# Patient Record
Sex: Female | Born: 1985
Health system: Southern US, Community
[De-identification: ages and names within clinical notes are randomized; demographics above are authoritative.]

## PROBLEM LIST (undated history)

## (undated) DIAGNOSIS — L404 Guttate psoriasis: Secondary | ICD-10-CM

## (undated) HISTORY — PX: APPENDECTOMY: SHX54

## (undated) HISTORY — PX: LAPAROSCOPIC APPENDECTOMY: SUR753

---
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
## 2003-11-28 ENCOUNTER — Other Ambulatory Visit: Admission: RE | Admit: 2003-11-28 | Discharge: 2003-11-28 | Payer: Self-pay | Admitting: Obstetrics and Gynecology

## 2005-01-25 ENCOUNTER — Other Ambulatory Visit: Admission: RE | Admit: 2005-01-25 | Discharge: 2005-01-25 | Payer: Self-pay | Admitting: Obstetrics and Gynecology

## 2006-03-22 ENCOUNTER — Inpatient Hospital Stay (HOSPITAL_COMMUNITY): Admission: AD | Admit: 2006-03-22 | Discharge: 2006-03-25 | Payer: Self-pay | Admitting: Obstetrics and Gynecology

## 2010-05-20 ENCOUNTER — Other Ambulatory Visit: Payer: Self-pay | Admitting: General Surgery

## 2010-05-20 ENCOUNTER — Ambulatory Visit (HOSPITAL_COMMUNITY)
Admission: AD | Admit: 2010-05-20 | Discharge: 2010-05-20 | Disposition: A | Discharge status: Home / Self Care | Payer: BC Managed Care – PPO | Source: Ambulatory Visit | Attending: *Deleted | Admitting: *Deleted

## 2010-05-20 ENCOUNTER — Observation Stay (HOSPITAL_COMMUNITY)
Admission: EM | Admit: 2010-05-20 | Discharge: 2010-05-21 | Disposition: A | Discharge status: Home / Self Care | Payer: BC Managed Care – PPO | Attending: Surgery | Admitting: Surgery

## 2010-05-20 ENCOUNTER — Ambulatory Visit (HOSPITAL_COMMUNITY)
Admission: RE | Admit: 2010-05-20 | Discharge: 2010-05-20 | Disposition: A | Discharge status: Home / Self Care | Payer: BC Managed Care – PPO | Source: Ambulatory Visit | Attending: *Deleted | Admitting: *Deleted

## 2010-05-20 ENCOUNTER — Other Ambulatory Visit: Payer: Self-pay | Admitting: *Deleted

## 2010-05-20 DIAGNOSIS — R1031 Right lower quadrant pain: Secondary | ICD-10-CM

## 2010-05-20 DIAGNOSIS — Z01812 Encounter for preprocedural laboratory examination: Secondary | ICD-10-CM | POA: Insufficient documentation

## 2010-05-20 DIAGNOSIS — R10813 Right lower quadrant abdominal tenderness: Secondary | ICD-10-CM | POA: Insufficient documentation

## 2010-05-20 DIAGNOSIS — K3580 Unspecified acute appendicitis: Principal | ICD-10-CM | POA: Insufficient documentation

## 2010-05-20 LAB — COMPREHENSIVE METABOLIC PANEL
AST: 15 U/L (ref 0–37)
Alkaline Phosphatase: 50 U/L (ref 39–117)
BUN: 7 mg/dL (ref 6–23)
CO2: 24 mEq/L (ref 19–32)
Chloride: 99 mEq/L (ref 96–112)
Creatinine, Ser: 0.66 mg/dL (ref 0.4–1.2)
GFR calc Af Amer: >60 mL/min (ref >60)
GFR calc non Af Amer: >60 mL/min (ref >60)
Total Bilirubin: 0.8 mg/dL (ref 0.3–1.2)

## 2010-05-20 LAB — CBC
Hemoglobin: 12.5 g/dL (ref 12.0–15.0)
MCH: 33.1 pg (ref 26.0–34.0)
RBC: 3.78 MIL/uL — ABNORMAL LOW (ref 3.87–5.11)

## 2010-05-20 LAB — DIFFERENTIAL
Basophils Relative: 0 % (ref 0–1)
Monocytes Relative: 8 % (ref 3–12)
Neutro Abs: 6 10*3/uL (ref 1.7–7.7)
Neutrophils Relative %: 63 % (ref 43–77)

## 2010-05-20 MED ORDER — IOHEXOL 300 MG/ML  SOLN: 100.0000 mL | Freq: Once | INTRAMUSCULAR | Status: DC | PRN

## 2010-05-21 LAB — GLUCOSE, CAPILLARY: Glucose-Capillary: 87 mg/dL (ref 70–99)

## 2010-05-21 LAB — CBC
Hemoglobin: 10.7 g/dL — ABNORMAL LOW (ref 12.0–15.0)
MCHC: 34.4 g/dL (ref 30.0–36.0)
Platelets: 210 10*3/uL (ref 150–400)

## 2010-05-22 NOTE — Op Note (Signed)
Linda Lopez, Linda Lopez       ACCOUNT NO.:  [id number]  MEDICAL RECORD NO.:  [id number]           PATIENT TYPE:  I  LOCATION:  5154                         FACILITY:  [facility]  PHYSICIAN:  Almond Lint, MD       DATE OF BIRTH:  05-20-1985  DATE OF PROCEDURE:  05/20/2010 DATE OF DISCHARGE:                              OPERATIVE REPORT   PREOPERATIVE DIAGNOSIS:  Acute appendicitis.  POSTOPERATIVE DIAGNOSIS:  Acute appendicitis.  PROCEDURE:  Laparoscopic appendectomy.  SURGEON:  Almond Lint, MD  ASSISTANT:  None.  ANESTHESIA:  General and local.  FINDINGS:  Retrocecal acutely inflamed appendix, cecum was adherent to the anterior abdominal wall.  SPECIMEN:  Appendix to Pathology.  ESTIMATED BLOOD LOSS:  Minimal.  COMPLICATIONS:  None known.  PROCEDURE:  Ms. Loudin was identified in the holding area and taken to operating room where she was placed supine on the operating room table.  General anesthesia was induced.  A Foley catheter was placed, and her left arm was tucked.  Her abdomen was prepped and draped in a sterile fashion.  Time-out was performed according to the surgical safety check list.  When all was correct, we continued.  The infraumbilical skin was anesthetized with local anesthetic, and a curvilinear transverse incision was made with a #15 blade.  The subcutaneous tissues were spread with a Kelly, and the umbilical fascia was elevated with Kocher clamp.  The fascia was elevated on both sides of the midline, and the midline was incised with a #15 blade.  The Tresa Endo was used to confirm entrance into the peritoneal cavity.  A 0 Vicryl pursestring suture was placed around the fascial incision and the Hasson trocar was introduced into the abdomen.  The tails of the suture were used to hold the trocar in place to the abdominal wall. Pneumoperitoneum was achieved to a pressure of 15 mmHg.    A 5-mm trocar was placed into the suprapubic region under  direct visualization after administration of local and additional one was placed into the left lower quadrant.  The cecum was visualized but the appendix was identified readily.  The cecum was reflected medially, and the appendix was adherent to the posterior cecum.  The appendix was not suppurative or gangrenous but certainly was firm and inflamed.  The appendix was elevated and the some of the alveolar attachments to the cecum were taken down bluntly.  The remainder of the attachments were taken down with the Harmonic scalpel.  Care was taken to keep the Harmonic scalpel way from the cecum.  The mesoappendix was identified, and this was divided with the Harmonic as well.  The appendiceal artery was divided with the harmonic scalpel.  Once the base of the appendix was skeletonized, the 10-mm scope was switched out to the 5-mm scope and the Endo-GIA was used to fire across the base of the appendix.  The appendix was then retrieved from the abdomen in an EndoCatch bag through the umbilical port.  The pneumoperitoneum was retrieved, and the staple line was examined.  There was no evidence of bleeding from the staple line, and there was no evidence of blood or purulent drainage in the  pelvis. This was irrigated slightly and some of the staples were removed with suction as well.  The four quadrant inspection was performed demonstrating no evidence of blood pooling or gross pathology in any of the other quadrants.  The staple line was reexamined and confirmed that there was no bleeding.  At this point, the patient was placed back flat, and the pneumoperitoneum was allowed to evacuate.  The pursestring suture was tied down with the 0 Vicryl.  There was no residual palpable fascial defect.  The skin of all the incisions was closed with 4-0 Monocryl in a subcuticular fashion.  The wounds were then cleaned, dried, and dressed with Dermabond.  The patient was awakened from anesthesia and taken to  PACU in stable condition.  Needle and sponge counts were correct.     Almond Lint, MD     FB/MEDQ  D:  05/20/2010  T:  05/21/2010  Job:  [id number]  Electronically Signed by Almond Lint MD on 05/22/2010 09:30:38 AM

## 2010-05-22 NOTE — H&P (Signed)
  NAMEMARCOS, Linda Lopez       ACCOUNT NO.:  [id number]  MEDICAL RECORD NO.:  [id number]           PATIENT TYPE:  I  LOCATION:  5154                         FACILITY:  [facility]  PHYSICIAN:  Almond Lint, MD       DATE OF BIRTH:  1986-01-03  DATE OF ADMISSION:  05/20/2010 DATE OF DISCHARGE:                             HISTORY & PHYSICAL   CHIEF COMPLAINT:  Abdominal pain.  HISTORY OF PRESENT ILLNESS:  Linda Lopez is a 25 year old female who started having abdominal pain last night.  That came gradually and has progressively worsened.  It has not gotten better with anything that she does and she has not had any appetite this morning and has fell nauseated.  She has been unable to eat.  Her pain was 7/10 prior receiving IV narcotics.  She denies fevers or chills.  She denies chest pain, shortness of breath or sick contacts.  The pain has always been in the right lower quadrant and has not migrated.  She does think it radiates a little bit to her right back.  PAST MEDICAL HISTORY:  Negative.  PAST SURGICAL HISTORY:  Negative.  FAMILY HISTORY:  No severe chronic illnesses that she is aware of.  SOCIAL HISTORY:  No substance abuse.  She is accompanied by her husband as well as son.  ALLERGIES:  DIFLUCAN causes edema of her mouth and lips.  MEDICATIONS:  Oral contraceptive pills.  REVIEW OF SYSTEMS:  Otherwise negative x11 systems.  PHYSICAL EXAMINATION:  VITAL SIGNS:  Temperature 98.4, pulse 77, respiratory rate 16, blood pressure 120/72, sats 100% on room air. GENERAL:  She is alert and oriented x3 in no acute distress and looks uncomfortable but has just been medicated. HEENT:  Normocephalic, atraumatic.  Sclerae anicteric. NECK:  Supple.  No lymphadenopathy.  No thyromegaly.  Trachea is midline. HEART:  Regular rate and rhythm.  No murmurs, rubs or gallops. LUNGS:  Clear to auscultation bilaterally.  No wheezing. ABDOMEN:  Soft, slightly distended, tender in the  right lower quadrant. Positive Rovsing sign. EXTREMITIES:  Warm and well perfused without pitting edema. SKIN:  No rashes. NEURO:  No gross motor sensory deficits.  LABORATORY FINDINGS:  White count 9.5, hemoglobin and hematocrit 12.5 And 35, platelet count 239,000.  CT shows dilated appendix and stranding consistent with early acute appendicitis.  Urine pregnancy is negative. UA is negative.  IMPRESSION:  Linda Lopez is a 26 year old female with classic history for acute appendicitis with imaging findings.  We will keep her n.p.o., IV fluids, IV antibiotics and OR for laparoscopic appendectomy. The procedure was described to the patient including the risks of the procedure including bleeding, infection, damage to other structures, I discussed the recovery time and the restrictions.  I discussed the actual procedure in detail and the hospital course.  We will take her to the operating room at the first available opportunity.     Almond Lint, MD     FB/MEDQ  D:  05/20/2010  T:  05/21/2010  Job:  [id number]  Electronically Signed by Almond Lint MD on 05/22/2010 09:30:06 AM

## 2012-04-03 ENCOUNTER — Encounter: Payer: Self-pay | Admitting: Gastroenterology

## 2012-04-20 ENCOUNTER — Telehealth: Payer: Self-pay | Admitting: Gastroenterology

## 2012-04-20 ENCOUNTER — Ambulatory Visit: Payer: BC Managed Care – PPO | Admitting: Gastroenterology

## 2012-04-20 NOTE — Telephone Encounter (Signed)
Do not charge per Dr. Stark. 

## 2013-12-13 ENCOUNTER — Encounter: Payer: Self-pay | Admitting: Gastroenterology

## 2013-12-14 ENCOUNTER — Other Ambulatory Visit: Payer: Self-pay | Admitting: Gastroenterology

## 2013-12-14 DIAGNOSIS — R1013 Epigastric pain: Secondary | ICD-10-CM

## 2013-12-22 ENCOUNTER — Encounter (INDEPENDENT_AMBULATORY_CARE_PROVIDER_SITE_OTHER): Payer: Self-pay

## 2013-12-22 ENCOUNTER — Ambulatory Visit
Admission: RE | Admit: 2013-12-22 | Discharge: 2013-12-22 | Disposition: A | Discharge status: Home / Self Care | Payer: BC Managed Care – PPO | Source: Ambulatory Visit | Attending: Gastroenterology | Admitting: Gastroenterology

## 2013-12-22 DIAGNOSIS — R1013 Epigastric pain: Secondary | ICD-10-CM

## 2014-02-11 ENCOUNTER — Ambulatory Visit: Payer: BC Managed Care – PPO | Admitting: Gastroenterology

## 2014-02-17 ENCOUNTER — Encounter (HOSPITAL_COMMUNITY): Payer: Self-pay | Admitting: Vascular Surgery

## 2014-02-17 ENCOUNTER — Emergency Department (HOSPITAL_COMMUNITY): Payer: 59

## 2014-02-17 ENCOUNTER — Emergency Department (HOSPITAL_COMMUNITY)
Admission: EM | Admit: 2014-02-17 | Discharge: 2014-02-17 | Disposition: A | Discharge status: Home / Self Care | Payer: 59 | Attending: Emergency Medicine | Admitting: Emergency Medicine

## 2014-02-17 DIAGNOSIS — R0789 Other chest pain: Secondary | ICD-10-CM | POA: Insufficient documentation

## 2014-02-17 DIAGNOSIS — R079 Chest pain, unspecified: Secondary | ICD-10-CM

## 2014-02-17 DIAGNOSIS — Z3202 Encounter for pregnancy test, result negative: Secondary | ICD-10-CM | POA: Insufficient documentation

## 2014-02-17 DIAGNOSIS — R0602 Shortness of breath: Secondary | ICD-10-CM | POA: Insufficient documentation

## 2014-02-17 DIAGNOSIS — Z79899 Other long term (current) drug therapy: Secondary | ICD-10-CM | POA: Insufficient documentation

## 2014-02-17 LAB — BASIC METABOLIC PANEL
Anion gap: 16 — ABNORMAL HIGH (ref 5–15)
BUN: 15 mg/dL (ref 6–23)
CHLORIDE: 102 meq/L (ref 96–112)
CO2: 21 meq/L (ref 19–32)
CREATININE: 0.77 mg/dL (ref 0.50–1.10)
Calcium: 9.9 mg/dL (ref 8.4–10.5)
GFR calc Af Amer: >90 mL/min (ref >90)
GFR calc non Af Amer: >90 mL/min (ref >90)
GLUCOSE: 93 mg/dL (ref 70–99)
Potassium: 3.7 mEq/L (ref 3.7–5.3)
Sodium: 139 mEq/L (ref 137–147)

## 2014-02-17 LAB — CBC
HEMATOCRIT: 38.4 % (ref 36.0–46.0)
Hemoglobin: 13.4 g/dL (ref 12.0–15.0)
MCH: 32.4 pg (ref 26.0–34.0)
MCHC: 34.9 g/dL (ref 30.0–36.0)
MCV: 92.8 fL (ref 78.0–100.0)
Platelets: 260 10*3/uL (ref 150–400)
RBC: 4.14 MIL/uL (ref 3.87–5.11)
RDW: 11.6 % (ref 11.5–15.5)
WBC: 11.3 10*3/uL — AB (ref 4.0–10.5)

## 2014-02-17 LAB — POC URINE PREG, ED: PREG TEST UR: NEGATIVE

## 2014-02-17 LAB — I-STAT TROPONIN, ED: Troponin i, poc: 0 ng/mL (ref 0.00–0.08)

## 2014-02-17 LAB — D-DIMER, QUANTITATIVE (NOT AT ARMC): D DIMER QUANT: 0.35 ug{FEU}/mL (ref 0.00–0.48)

## 2014-02-17 MED ORDER — KETOROLAC TROMETHAMINE 30 MG/ML IJ SOLN
30.0000 mg | Freq: Once | INTRAMUSCULAR | Status: AC
  Administered 2014-02-17: 30 mg via INTRAVENOUS
  Filled 2014-02-17: qty 1

## 2014-02-17 MED ORDER — OMEPRAZOLE 20 MG PO CPDR
20.0000 mg | DELAYED_RELEASE_CAPSULE | Freq: Every day | ORAL | Status: DC
Start: 2014-02-17 — End: 2018-09-22

## 2014-02-17 MED ORDER — NAPROXEN 500 MG PO TABS: 500.0000 mg | ORAL_TABLET | Freq: Two times a day (BID) | ORAL | Status: DC

## 2014-02-17 MED ORDER — MORPHINE SULFATE 4 MG/ML IJ SOLN
4.0000 mg | Freq: Once | INTRAMUSCULAR | Status: AC
  Administered 2014-02-17: 4 mg via INTRAVENOUS
  Filled 2014-02-17: qty 1

## 2014-02-17 NOTE — ED Provider Notes (Signed)
CSN: [id number]     Arrival date & time 02/17/14  1711 History   First MD Initiated Contact with Patient 02/17/14 1835     Chief Complaint  Patient presents with  . Chest Pain     (Consider location/radiation/quality/duration/timing/severity/associated sxs/prior Treatment) Patient is a 28 y.o. female presenting with chest pain. The history is provided by the patient. No language interpreter was used.  Chest Pain Pain location:  Substernal area Pain quality: sharp   Pain radiates to:  Upper back Pain radiates to the back: yes   Pain severity:  Moderate Onset quality:  Unable to specify Duration: since at least 6:30am. Timing:  Constant Progression:  Waxing and waning Chronicity:  New Context: at rest   Relieved by:  Nothing Worsened by:  Movement, deep breathing and certain positions Ineffective treatments:  Rest Associated symptoms: shortness of breath   Associated symptoms: no abdominal pain, no anorexia, no anxiety, no back pain, no claudication, no cough, no diaphoresis, no fatigue, no fever, no headache, no lower extremity edema, no nausea, no numbness, no palpitations, not vomiting and no weakness   Risk factors: birth control   Risk factors: no aortic disease, no coronary artery disease, no diabetes mellitus, no Ehlers-Danlos syndrome, no high cholesterol, no hypertension, not female, no Marfan's syndrome, not obese, not pregnant, no prior DVT/PE, no smoking and no surgery     History reviewed. No pertinent past medical history. Past Surgical History  Procedure Laterality Date  . Laparoscopic appendectomy     No family history on file. History  Substance Use Topics  . Smoking status: Never Smoker   . Smokeless tobacco: Never Used  . Alcohol Use: No   OB History    No data available     Review of Systems  Constitutional: Negative for fever, chills, diaphoresis, activity change, appetite change and fatigue.  HENT: Negative for congestion, facial swelling,  rhinorrhea and sore throat.   Eyes: Negative for photophobia and discharge.  Respiratory: Positive for shortness of breath. Negative for cough and chest tightness.   Cardiovascular: Positive for chest pain. Negative for palpitations, claudication and leg swelling.  Gastrointestinal: Negative for nausea, vomiting, abdominal pain, diarrhea and anorexia.  Endocrine: Negative for polydipsia and polyuria.  Genitourinary: Negative for dysuria, frequency, difficulty urinating and pelvic pain.  Musculoskeletal: Negative for back pain, arthralgias, neck pain and neck stiffness.  Skin: Negative for color change and wound.  Allergic/Immunologic: Negative for immunocompromised state.  Neurological: Negative for facial asymmetry, weakness, numbness and headaches.  Hematological: Does not bruise/bleed easily.  Psychiatric/Behavioral: Negative for confusion and agitation.      Allergies  Diflucan  Home Medications   Prior to Admission medications   Medication Sig Start Date End Date Taking? Authorizing Provider  ibuprofen (ADVIL,MOTRIN) 200 MG tablet Take 200 mg by mouth every 6 (six) hours as needed for mild pain.   Yes Historical Provider, MD  TRI-PREVIFEM 0.18/0.215/0.25 MG-35 MCG tablet Take 1 tablet by mouth daily. 02/02/14  Yes Historical Provider, MD  naproxen (NAPROSYN) 500 MG tablet Take 1 tablet (500 mg total) by mouth 2 (two) times daily. 02/17/14   Toy CookeyMegan Docherty, MD  omeprazole (PRILOSEC) 20 MG capsule Take 1 capsule (20 mg total) by mouth daily. 02/17/14   Toy CookeyMegan Docherty, MD   BP 122/64 mmHg  Pulse 67  Temp(Src) 98.1 F (36.7 C)  Resp 22  SpO2 100%  LMP 02/03/2014 (Approximate) Physical Exam  Constitutional: She is oriented to person, place, and time. She appears well-developed and  well-nourished. No distress.  HENT:  Head: Normocephalic and atraumatic.  Mouth/Throat: No oropharyngeal exudate.  Eyes: Pupils are equal, round, and reactive to light.  Neck: Normal range of  motion. Neck supple.  Cardiovascular: Normal rate, regular rhythm and normal heart sounds.  Exam reveals no gallop and no friction rub.   No murmur heard. Pulmonary/Chest: Effort normal and breath sounds normal. No respiratory distress. She has no wheezes. She has no rales.    Abdominal: Soft. Bowel sounds are normal. She exhibits no distension and no mass. There is no tenderness. There is no rebound and no guarding.  Musculoskeletal: Normal range of motion. She exhibits no edema or tenderness.  Neurological: She is alert and oriented to person, place, and time.  Skin: Skin is warm and dry.  Psychiatric: She has a normal mood and affect.    ED Course  Procedures (including critical care time) Labs Review Labs Reviewed  CBC - Abnormal; Notable for the following:    WBC 11.3 (*)    All other components within normal limits  BASIC METABOLIC PANEL - Abnormal; Notable for the following:    Anion gap 16 (*)    All other components within normal limits  D-DIMER, QUANTITATIVE  I-STAT TROPOININ, ED  POC URINE PREG, ED    Imaging Review Dg Chest 2 View  02/17/2014   CLINICAL DATA:  Chest pain.  EXAM: CHEST  2 VIEW  COMPARISON:  None.  FINDINGS: The heart size and mediastinal contours are within normal limits. Both lungs are clear. No pneumothorax or pleural effusion is noted. The visualized skeletal structures are unremarkable.  IMPRESSION: No acute cardiopulmonary abnormality seen.   Electronically Signed   By: Roque Lias M.D.   On: 02/17/2014 18:04     EKG Interpretation   Date/Time:  Thursday February 17 2014 17:19:36 EST Ventricular Rate:  94 PR Interval:  118 QRS Duration: 92 QT Interval:  434 QTC Calculation: 542 R Axis:   69 Text Interpretation:  Normal sinus rhythm with sinus arrhythmia Incomplete  right bundle branch block Nonspecific ST abnormality Prolonged QT QT  prolongation new No prior for comparison except from today Confirmed by  DOCHERTY  MD, MEGAN [phone]) on  02/17/2014 6:40:21 PM      MDM   Final diagnoses:  Atypical chest pain    Pt is a 28 y.o. female with Pmhx as above who presents with sharp midsternal chest pain with radiation to her back since about 6:30 this morning.  Pain is worse with movement and with deep breathing.  He has been constant though waxing and waning.  No associated diaphoresis, nausea, vomiting, no leg pain or swelling.  She's had no recent travel history.  She reports history of GERD and states that she stopped taking Prilosec about a month ago.  On physical exam vital signs stable.  Patient is in no acute distress.  She has reproducible midsternal chest pain.  Cardiopulmonary exam otherwise benign.  EKG without acute ischemic changes.  Troponin negative.  Chest x-ray normal.  D-dimer added as cannot use PERC rule.  D-dimer normal.  Patient felt much improved after 1 dose of Toradol and 1 dose of morphine.  She did have one episode of increased chest pain and appeared anxious with a heart rate of 110.  Vital signs since have remained stable.  She appears in no acute distress.  I doubt ACS, PE, dissection, pneumonia.  I feel pain is much more likely to be GI or musculoskeletal in nature.  I recommended she restart her Prilosec and also recommended scheduled naproxen twice a day.   Tianah L Ashe evaluation in the Emergency Department is complete. It has been determined that no acute conditions requiring further emergency intervention are present at this time. The patient/guardian have been advised of the diagnosis and plan. We have discussed signs and symptoms that warrant return to the ED, such as changes or worsening in symptoms, worsening pain, fever, leg pain or swelling.       Toy CookeyMegan Docherty, MD 02/18/14 [phone]

## 2014-02-17 NOTE — ED Notes (Signed)
Patient called for increased chest pain, heart rate 110 and patient is anxious. Reported pain and heart rate to Dr. Micheline Mazeocherty. MD acknowledges. No verbal orders at this time

## 2014-02-17 NOTE — ED Notes (Signed)
Pt reports to the ED for eval of midsternal CP. [facility] Pt describes the pain as sharp and stabbing in nature. Pt reports associated symptoms of SOB, diaphoresis, and lightheadedness. Pain is worse with inspiration. She denies any long car rides or trips. She is on the generic version of ortho-tri-cyclin. Pt slightly tachypnic. Pt A&Ox4. Face appears flushed. Skin warm and dry.

## 2014-02-17 NOTE — Discharge Instructions (Signed)

## 2015-04-11 DIAGNOSIS — W273XXA Contact with needle (sewing), initial encounter: Secondary | ICD-10-CM | POA: Diagnosis not present

## 2015-04-11 DIAGNOSIS — S91331A Puncture wound without foreign body, right foot, initial encounter: Secondary | ICD-10-CM | POA: Diagnosis not present

## 2015-04-11 DIAGNOSIS — Z7689 Persons encountering health services in other specified circumstances: Secondary | ICD-10-CM | POA: Diagnosis not present

## 2015-04-27 DIAGNOSIS — Z7721 Contact with and (suspected) exposure to potentially hazardous body fluids: Secondary | ICD-10-CM | POA: Diagnosis not present

## 2015-05-10 DIAGNOSIS — Z7721 Contact with and (suspected) exposure to potentially hazardous body fluids: Secondary | ICD-10-CM | POA: Diagnosis not present

## 2015-05-23 DIAGNOSIS — Z7721 Contact with and (suspected) exposure to potentially hazardous body fluids: Secondary | ICD-10-CM | POA: Diagnosis not present

## 2015-06-04 ENCOUNTER — Emergency Department
Admission: EM | Admit: 2015-06-04 | Discharge: 2015-06-04 | Disposition: A | Discharge status: Home / Self Care | Payer: 59 | Attending: Student | Admitting: Student

## 2015-06-04 ENCOUNTER — Encounter: Payer: Self-pay | Admitting: *Deleted

## 2015-06-04 DIAGNOSIS — K0889 Other specified disorders of teeth and supporting structures: Secondary | ICD-10-CM | POA: Diagnosis present

## 2015-06-04 DIAGNOSIS — K047 Periapical abscess without sinus: Secondary | ICD-10-CM

## 2015-06-04 DIAGNOSIS — K046 Periapical abscess with sinus: Secondary | ICD-10-CM | POA: Diagnosis not present

## 2015-06-04 MED ORDER — ONDANSETRON 8 MG PO TBDP
8.0000 mg | ORAL_TABLET | Freq: Once | ORAL | Status: AC
  Administered 2015-06-04: 8 mg via ORAL
  Filled 2015-06-04: qty 1

## 2015-06-04 MED ORDER — ONDANSETRON 4 MG PO TBDP: 4.0000 mg | ORAL_TABLET | Freq: Three times a day (TID) | ORAL | Status: DC | PRN

## 2015-06-04 MED ORDER — OXYCODONE-ACETAMINOPHEN 5-325 MG PO TABS
1.0000 | ORAL_TABLET | Freq: Once | ORAL | Status: AC
  Administered 2015-06-04: 1 via ORAL

## 2015-06-04 MED ORDER — OXYCODONE-ACETAMINOPHEN 5-325 MG PO TABS
ORAL_TABLET | ORAL | Status: AC
  Filled 2015-06-04: qty 1

## 2015-06-04 MED ORDER — OXYCODONE-ACETAMINOPHEN 5-325 MG PO TABS
1.0000 | ORAL_TABLET | ORAL | Status: DC | PRN
  Administered 2015-06-04: 1 via ORAL
  Filled 2015-06-04: qty 1

## 2015-06-04 MED ORDER — AMOXICILLIN 500 MG PO CAPS: 500.0000 mg | ORAL_CAPSULE | Freq: Three times a day (TID) | ORAL | Status: DC

## 2015-06-04 MED ORDER — ONDANSETRON 4 MG PO TBDP
4.0000 mg | ORAL_TABLET | Freq: Once | ORAL | Status: AC | PRN
  Administered 2015-06-04: 4 mg via ORAL

## 2015-06-04 MED ORDER — OXYCODONE-ACETAMINOPHEN 5-325 MG PO TABS: 1.0000 | ORAL_TABLET | ORAL | Status: DC | PRN

## 2015-06-04 MED ORDER — ONDANSETRON 4 MG PO TBDP
ORAL_TABLET | ORAL | Status: AC
  Filled 2015-06-04: qty 1

## 2015-06-04 MED ORDER — IBUPROFEN 600 MG PO TABS
600.0000 mg | ORAL_TABLET | Freq: Once | ORAL | Status: AC
  Administered 2015-06-04: 600 mg via ORAL
  Filled 2015-06-04: qty 1

## 2015-06-04 NOTE — Discharge Instructions (Signed)
Dental Abscess A dental abscess is pus in or around a tooth. HOME CARE  Take medicines only as told by your dentist.  If you were prescribed antibiotic medicine, finish all of it even if you start to feel better.  Rinse your mouth (gargle) often with salt water.  Do not drive or use heavy machinery, like a lawn mower, while taking pain medicine.  Do not apply heat to the outside of your mouth.  Keep all follow-up visits as told by your dentist. This is important. GET HELP IF:  Your pain is worse, and medicine does not help. GET HELP RIGHT AWAY IF:  You have a fever or chills.  Your symptoms suddenly get worse.  You have a very bad headache.  You have problems breathing or swallowing.  You have trouble opening your mouth.  You have puffiness (swelling) in your neck or around your eye.   This information is not intended to replace advice given to you by your health care provider. Make sure you discuss any questions you have with your health care provider.   Document Released: 07/12/2014 Document Reviewed: 07/12/2014 Elsevier Interactive Patient Education [employer]2016 Elsevier Inc.    call your dentist or one of the clinics listed below  Begin taking Amoxil 3 times a day for 10 days, Percocet for pain and ibuprofen for inflammation  OPTIONS FOR DENTAL FOLLOW UP CARE  [place] Department of Health and Human Services - Local Safety Net Dental Clinics TripDoors.comhttp://www.ncdhhs.gov/dph/oralhealth/services/safetynetclinics.htm   [facility] [phone])  [facility] [phone])  [facility] [phone])  [facility] [phone])  [facility] [phone]) This clinic caters to the indigent population and is on a lottery system. Location: [employer] [address] [address], [address], [place] Clinic Hours: Wednesdays from 6pm - 9pm, patients seen by a lottery system. For dates, call or go to  [email or web address] Services: Cleanings, fillings and simple extractions. Payment Options: DENTAL WORK IS FREE OF CHARGE. Bring proof of income or support. Best way to get seen: Arrive at 5:15 pm - this is a lottery, NOT first come/first serve, so arriving earlier will not increase your chances of being seen.     [facility] [phone] Select option 1 for emergencies   Location: [facility], [place], [address], [place] [facility] Hours: No walk-ins accepted - call the day before to schedule an appointment. Check in times are 9:30 am and 1:30 pm. Services: Simple extractions, temporary fillings, pulpectomy/pulp debridement, uncomplicated abscess drainage. Payment Options: PAYMENT IS DUE AT THE TIME OF SERVICE.  Fee is usually $100-200, additional surgical procedures (e.g. abscess drainage) may be extra. Cash, checks, Visa/MasterCard accepted.  Can file Medicaid if patient is covered for dental - patient should call case worker to check. No discount for [facility] patients. Best way to get seen: MUST call the day before and get onto the schedule. Can usually be seen the next 1-2 days. No walk-ins accepted.     [facility] [phone]   Location: [facility], [address], [place] [facility] Hours: M, W, Th, F 8am or 1:30pm, Tues 9a or 1:30 - first come/first served. Services: Simple extractions, temporary fillings, uncomplicated abscess drainage.  You do not need to be an [place] resident. Payment Options: PAYMENT IS DUE AT THE TIME OF SERVICE. Dental insurance, otherwise sliding scale - bring proof of income or support. Depending on income and  treatment needed, cost is usually $50-200. Best way to get seen: Arrive early as it is first come/first served.     [facility] [phone]   Location: [address] Clinic Hours: Mon-Thu 8a-5p Services: Most basic dental services including extractions and fillings. Payment Options: PAYMENT IS DUE AT THE TIME OF SERVICE. Sliding scale, up to 50% off - bring proof if income or support. Medicaid with dental option accepted. Best way to get seen: Call to schedule an appointment, can usually be seen within 2 weeks OR they will try to see walk-ins - show up at 8a or 2p (you may have to wait).     [facility] [phone] [place] RESIDENTS ONLY   Location: [facility], [address] [place], [place] [address] Clinic Hours: By appointment only. Monday - Thursday 8am-5pm, Friday 8am-12pm Services: Cleanings, fillings, extractions. Payment Options: PAYMENT IS DUE AT THE TIME OF SERVICE. Cash, Visa or MasterCard. Sliding scale - $30 minimum per service. Best way to get seen: Come in to office, complete packet and make an appointment - need proof of income or support monies for each household member and proof of [facility] residence. Usually takes about a month to get in.     [facility] [phone]   Location: [address], [facility] Clinic Hours: Walk-in Urgent Care Dental Services are offered Monday-Friday mornings only. The numbers of emergencies accepted daily is limited to the number of providers available. Maximum 15 - Mondays, Wednesdays & Thursdays Maximum 10 - Tuesdays & Fridays Services: You do not need to be a [facility] resident to be seen for a dental emergency. Emergencies are defined as pain, swelling, abnormal bleeding, or dental trauma. Walkins will receive x-rays if needed. NOTE: Dental cleaning is not an emergency. Payment Options: PAYMENT IS DUE AT THE TIME OF SERVICE. Minimum co-pay is $40.00 for uninsured patients. Minimum co-pay is $3.00 for Medicaid with dental coverage. Dental Insurance is accepted and must be presented at time of  visit. Medicare does not cover dental. Forms of payment: Cash, credit card, checks. Best way to get seen: If not previously registered with the clinic, walk-in dental registration begins at 7:15 am and is on a first come/first serve basis. If previously registered with the clinic, call to make an appointment.     [facility] [phone] [place] RESIDENTS ONLY   Location: [address] [place], [place] Clinic Hours: Mon-Thu 10a-2p Services: Extractions only! Payment Options: FREE (donations accepted) - bring proof of income or support Best way to get seen: Call and schedule an appointment OR come at 8am on the 1st Monday of every month (except for holidays) when it is first come/first served.     [facility] [phone]   Location: [address] [place], [place] Clinic Hours: Friday mornings Services, Payment Options, Best way to get seen: Call for info

## 2015-06-04 NOTE — ED Notes (Signed)
Pt reports pain/swelling of left upper jaw beginning Friday. Pt reports 2 emeses in last 24 hours. Pr c/o of nausea, and pain rated at a 6 out of 10 beginning at the upper left jaw, radiating down neck. PT c/o of chills, denies fever.

## 2015-06-04 NOTE — ED Provider Notes (Signed)
[facility] Emergency Department Provider Note  ____________________________________________  Time seen: Approximately 7:36 AM  I have reviewed the triage vital signs and the nursing notes.   HISTORY  Chief Complaint Dental Pain   HPI Linda Lopez is a 30 y.o. female is here with complaint of dental pain and abscess beginning Friday night. Patient states she has some swelling to her left upper jaw beginning Friday and has gotten slowly bigger during the course of the last 24 hours. Patient has been nauseous and has vomited secondary to pain. She has taken Tylenol and ibuprofen without relief.prior to examination she had been given a Percocet which helped with the pain. Prior to the Percocet patient rates her pain at 10 over 10.   History reviewed. No pertinent past medical history.  There are no active problems to display for this patient.   Past Surgical History  Procedure Laterality Date  . Laparoscopic appendectomy    . Appendectomy      Current Outpatient Rx  Name  Route  Sig  Dispense  Refill  . amoxicillin (AMOXIL) 500 MG capsule   Oral   Take 1 capsule (500 mg total) by mouth 3 (three) times daily.   30 capsule   0   . ibuprofen (ADVIL,MOTRIN) 200 MG tablet   Oral   Take 200 mg by mouth every 6 (six) hours as needed for mild pain.         . naproxen (NAPROSYN) 500 MG tablet   Oral   Take 1 tablet (500 mg total) by mouth 2 (two) times daily.   20 tablet   0   . omeprazole (PRILOSEC) 20 MG capsule   Oral   Take 1 capsule (20 mg total) by mouth daily.   30 capsule   0   . ondansetron (ZOFRAN ODT) 4 MG disintegrating tablet   Oral   Take 1 tablet (4 mg total) by mouth every 8 (eight) hours as needed for nausea or vomiting.   20 tablet   0   . oxyCODONE-acetaminophen (PERCOCET) 5-325 MG tablet   Oral   Take 1 tablet by mouth every 4 (four) hours as needed for severe pain.   20 tablet   0   . TRI-PREVIFEM 0.18/0.215/0.25  MG-35 MCG tablet   Oral   Take 1 tablet by mouth daily.      12     Allergies Diflucan  History reviewed. No pertinent family history.  Social History Social History  Substance Use Topics  . Smoking status: Never Smoker   . Smokeless tobacco: Never Used  . Alcohol Use: No    Review of Systems Constitutional: No fever/chills ENT: No sore throat.as of dental pain. Cardiovascular: Denies chest pain. Respiratory: Denies shortness of breath. Gastrointestinal: No abdominal pain.  Positive nausea, positive vomiting.   Neurological: Negative for headaches  10-point ROS otherwise negative.  ____________________________________________   PHYSICAL EXAM:  VITAL SIGNS: ED Triage Vitals  Enc Vitals Group     BP 06/04/15 0256 132/96 mmHg     Pulse Rate 06/04/15 0256 60     Resp 06/04/15 0256 22     Temp 06/04/15 0256 98.4 F (36.9 C)     Temp Source 06/04/15 0256 Oral     SpO2 06/04/15 0256 100 %     Weight 06/04/15 0256 138 lb (62.596 kg)     Height 06/04/15 0256  (1.702 m)     Head Cir --      Peak  Flow --      Pain Score 06/04/15 0257 10     Pain Loc --      Pain Edu? --      Excl. in GC? --     Constitutional: Alert and oriented. Well appearing and in no acute distress. Eyes: Conjunctivae are normal. PERRL. EOMI. Head: Atraumatic. Nose: No congestion/rhinnorhea. Mouth/Throat: Mucous membranes are moist.  Oropharynx non-erythematous.left upper gum mid line molar area there is a small raised area that appears to be a localized abscess to area. Gum around the tooth is edematous and tender. Neck: No stridor.   Hematological/Lymphatic/Immunilogical: No cervical lymphadenopathy. Cardiovascular: Normal rate, regular rhythm. Grossly normal heart sounds.  Good peripheral circulation. Respiratory: Normal respiratory effort.  No retractions. Lungs CTAB. Musculoskeletal:moves upper and lower extremities without any difficulty. Neurologic:  Normal speech and language.  No gross focal neurologic deficits are appreciated. No gait instability. Skin:  Skin is warm, dry and intact. No rash noted. Psychiatric: Mood and affect are normal. Speech and behavior are normal.  ____________________________________________   LABS (all labs ordered are listed, but only abnormal results are displayed)  Labs Reviewed - No data to display  PROCEDURES  Procedure(s) performed: None  Critical Care performed: No  ____________________________________________   INITIAL IMPRESSION / ASSESSMENT AND PLAN / ED COURSE  Pertinent labs & imaging results that were available during my care of the patient were reviewed by me and considered in my medical decision making (see chart for details).  Patient was given a prescription for amoxicillin 500 mg 3 times a day due to no insurance. She is also given a prescription for Zofran and Percocet.patient was given a list of dental clinics to follow up with.She is to start on the antibiotic today. ____________________________________________   FINAL CLINICAL IMPRESSION(S) / ED DIAGNOSES  Final diagnoses:  Dental abscess      Tommi RumpsRhonda L Keandrea Tapley, PA-C 06/04/15 0830  Gayla DossEryka A Gayle, MD 06/04/15 1233

## 2015-06-04 NOTE — ED Notes (Signed)
Pt presents w/ c/o dental pain starting Friday. Pt states her dentist was closed and the pain has worsened to causing pain under her eye and to her ear and jaw. Pt states pain is making her vomit. Pt states she has been taking tylenol and ibuprofen w/o relief.

## 2015-06-12 DIAGNOSIS — F429 Obsessive-compulsive disorder, unspecified: Secondary | ICD-10-CM | POA: Diagnosis not present

## 2015-06-12 DIAGNOSIS — Z01419 Encounter for gynecological examination (general) (routine) without abnormal findings: Secondary | ICD-10-CM | POA: Diagnosis not present

## 2015-06-12 DIAGNOSIS — Z1389 Encounter for screening for other disorder: Secondary | ICD-10-CM | POA: Diagnosis not present

## 2015-06-12 DIAGNOSIS — Z6821 Body mass index (BMI) 21.0-21.9, adult: Secondary | ICD-10-CM | POA: Diagnosis not present

## 2015-06-12 DIAGNOSIS — Z130 Encounter for screening for diseases of the blood and blood-forming organs and certain disorders involving the immune mechanism: Secondary | ICD-10-CM | POA: Diagnosis not present

## 2015-07-04 DIAGNOSIS — Z7721 Contact with and (suspected) exposure to potentially hazardous body fluids: Secondary | ICD-10-CM | POA: Diagnosis not present

## 2015-07-26 IMAGING — US US ABDOMEN COMPLETE
1 series · 14 of 25 positions shown · non-contrast
Comparison: CT abdomen pelvis of 05/20/2010

CLINICAL DATA: Epigastric abdominal pain

EXAM:
ULTRASOUND ABDOMEN COMPLETE

[Series 1: us abdomen complete · 0.26mm/px · 14 of 88 slices shown]
[im 1/88]
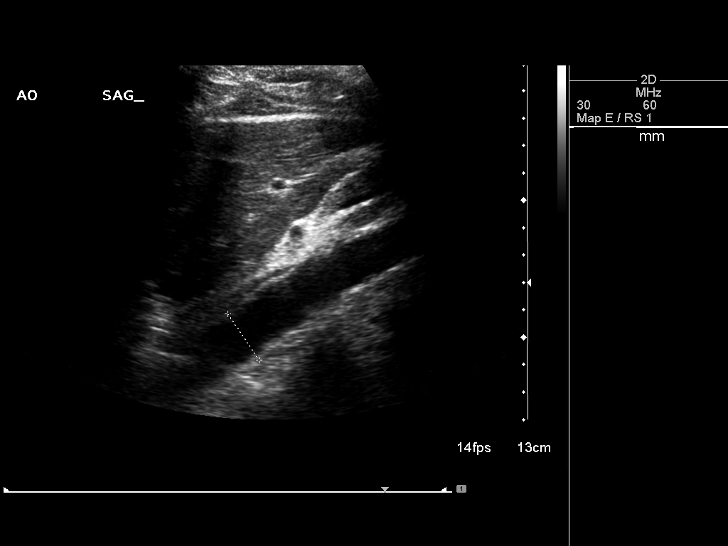
[im 8/88]
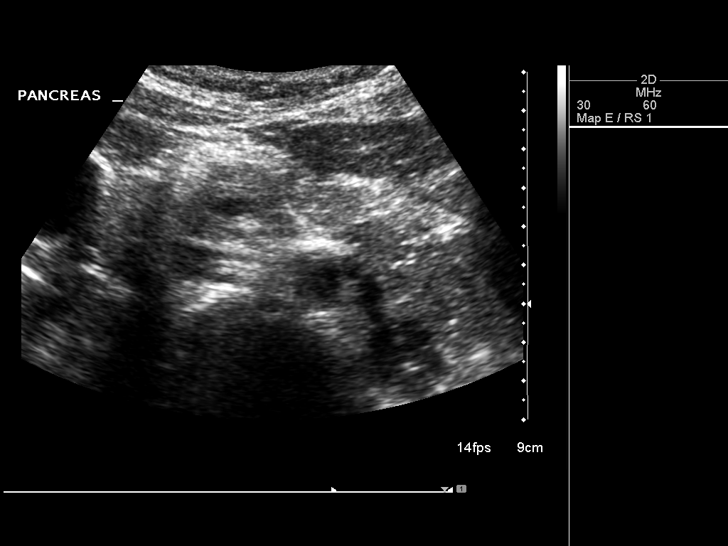
[im 15/88]
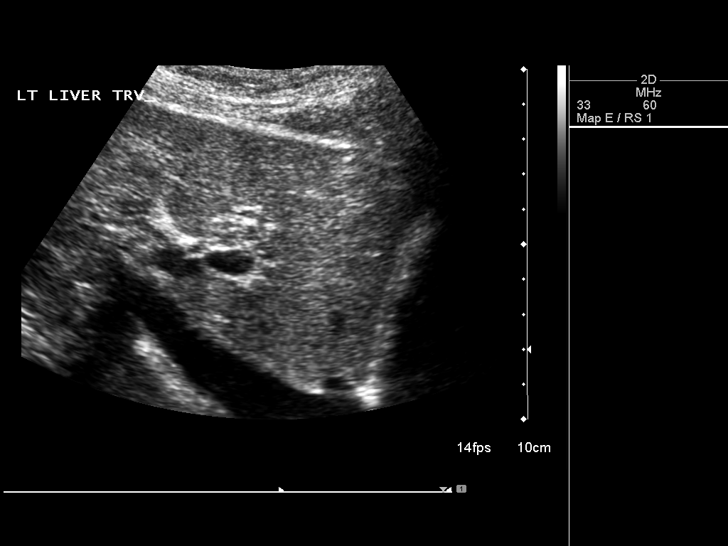
[im 22/88]
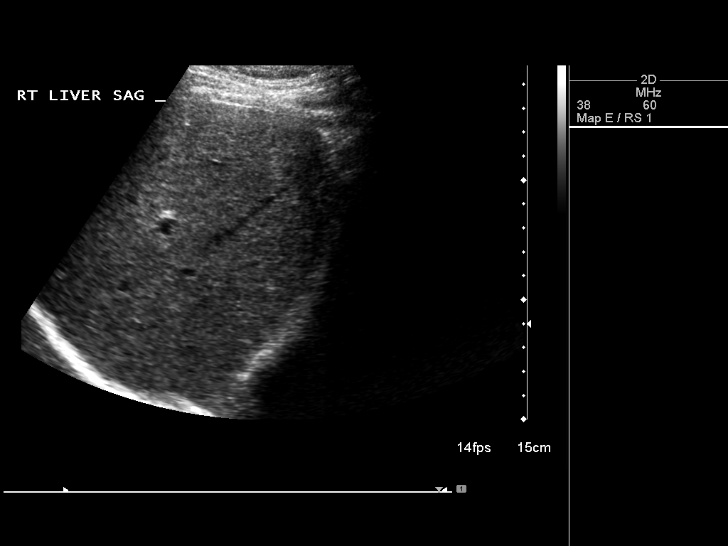
[im 30/88]
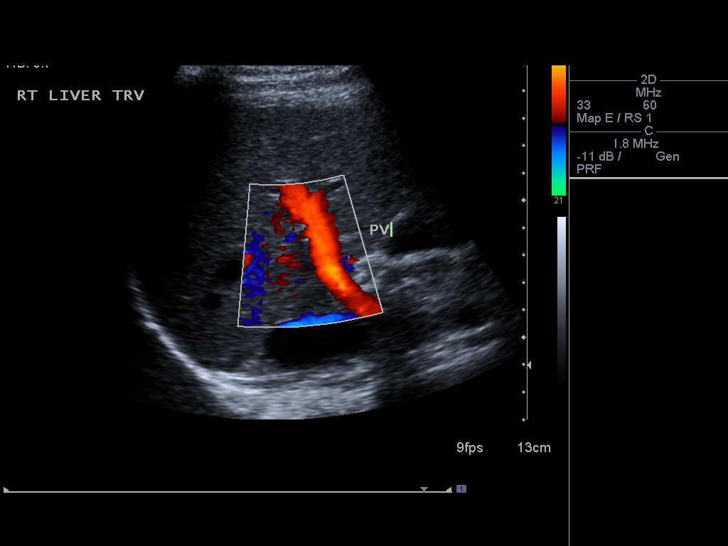
[im 33/88]
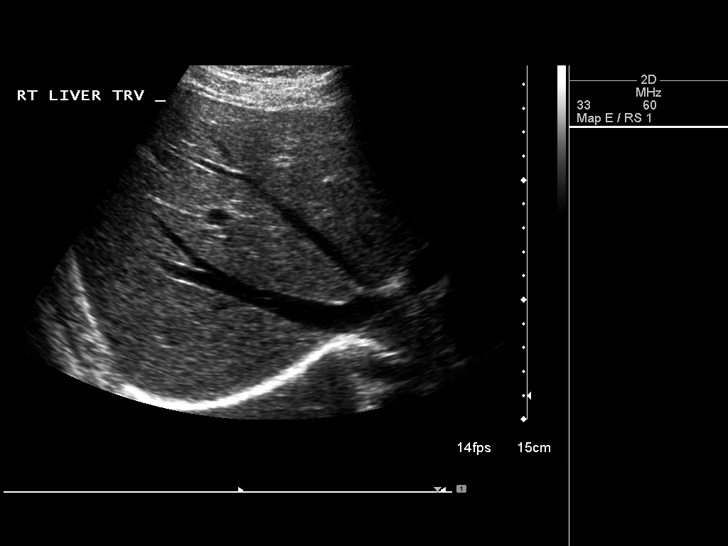
[im 40/88]
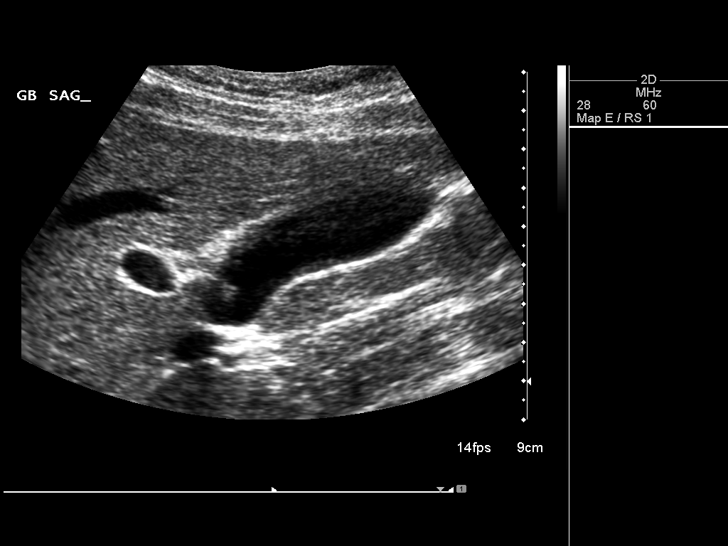
[im 48/88]
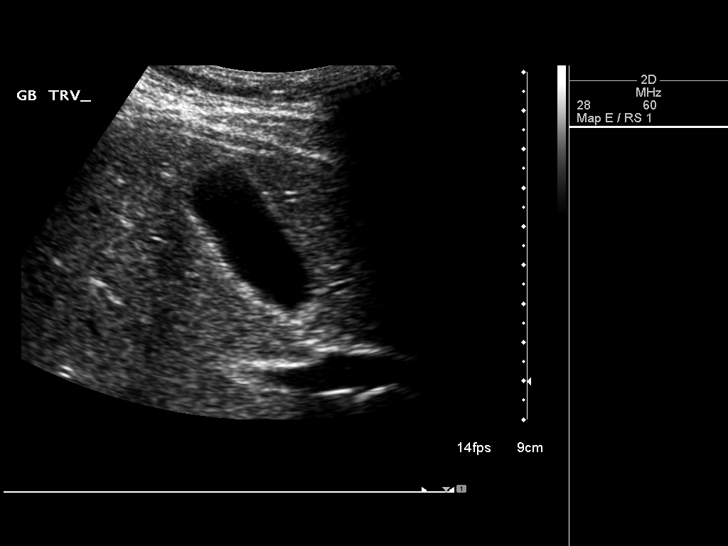
[im 55/88]
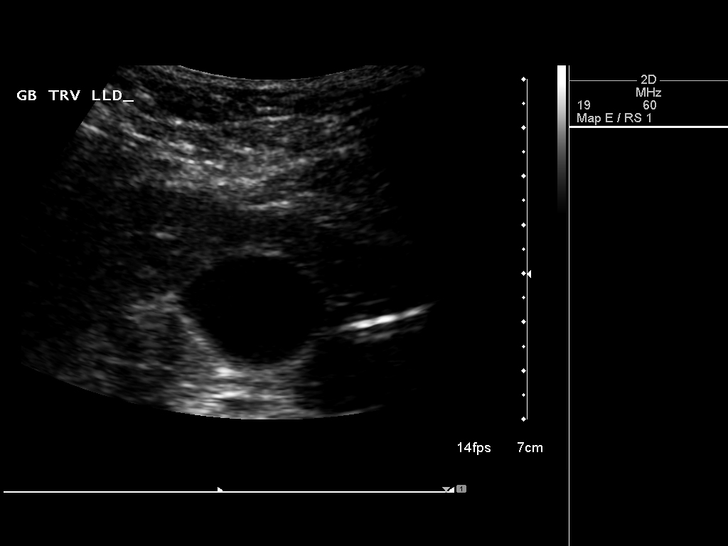
[im 59/88]
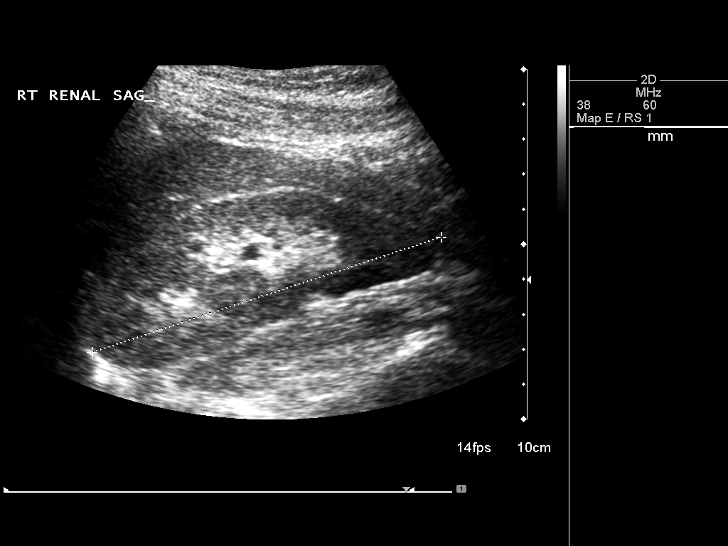
[im 66/88]
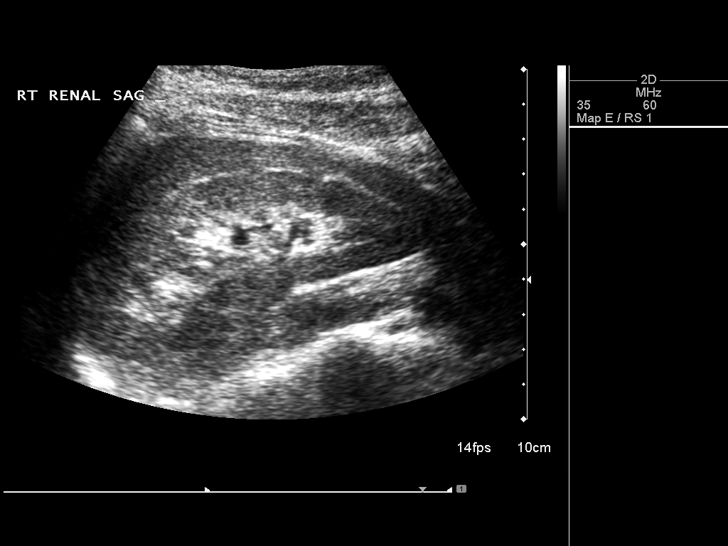
[im 73/88]
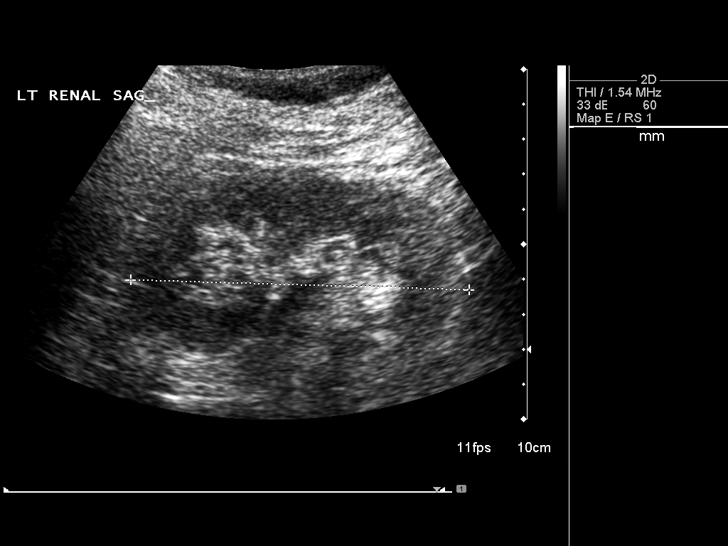
[im 80/88]
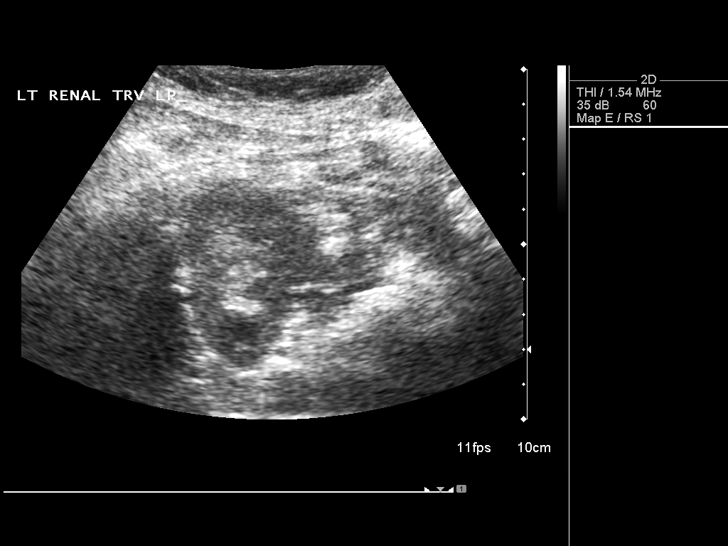
[im 88/88]
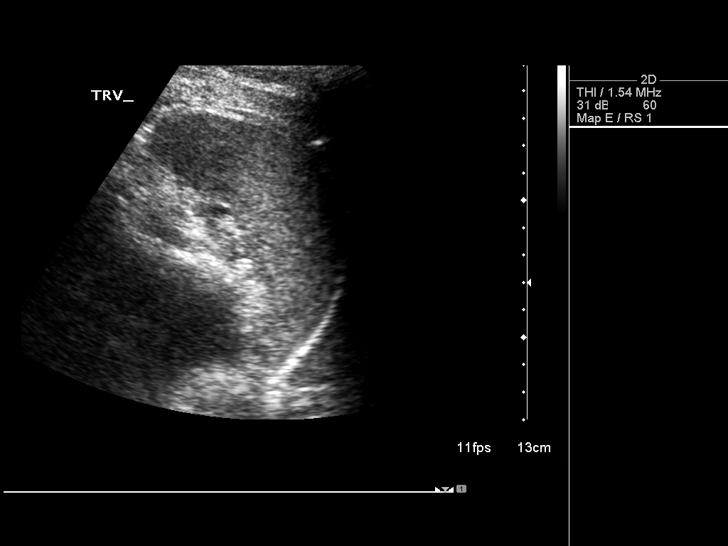

[14 of 25 positions shown; findings below may reference images not displayed]

FINDINGS: Gallbladder: The gallbladder is visualized and no gallstones are
noted. There is no pain over the gallbladder with compression.

Common bile duct: Diameter: The common bile duct is normal measuring
2.1 mm in diameter.

Liver: The liver has a normal echogenic pattern. No focal
abnormality is seen.

IVC: No abnormality visualized.

Pancreas: Visualized portion unremarkable.

Spleen: The spleen is normal measuring 7.2 cm.

Right Kidney: Length: 10.5 cm..  No hydronephrosis is seen.

Left Kidney: Length: 9.7 cm..  No hydronephrosis is noted.

Abdominal aorta: The abdominal aorta is normal caliber.

Other findings: None.
IMPRESSION: Negative abdominal ultrasound.

## 2015-09-21 IMAGING — DX DG CHEST 2V
2 series · 2 of 2 positions shown · non-contrast
Comparison: None.

CLINICAL DATA: Chest pain.

EXAM:
CHEST  2 VIEW

[chest pa]
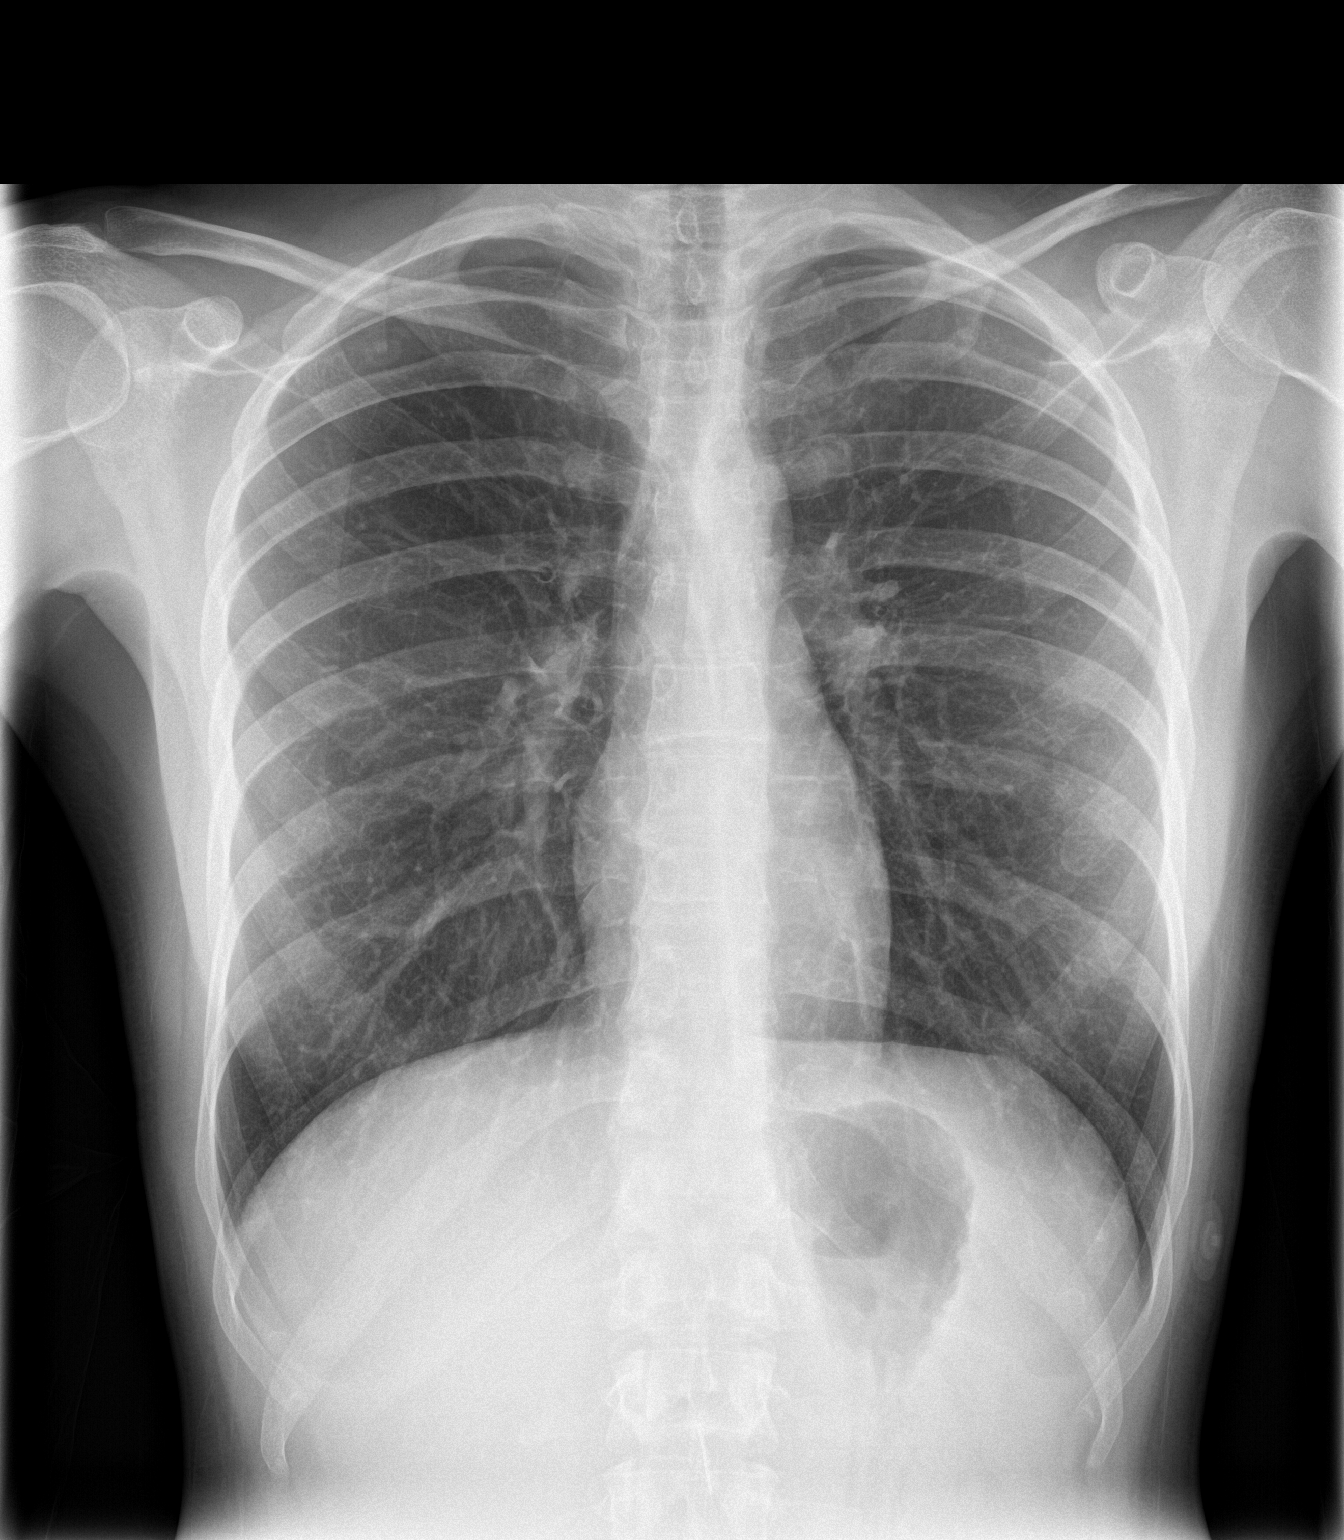

[chest lat]
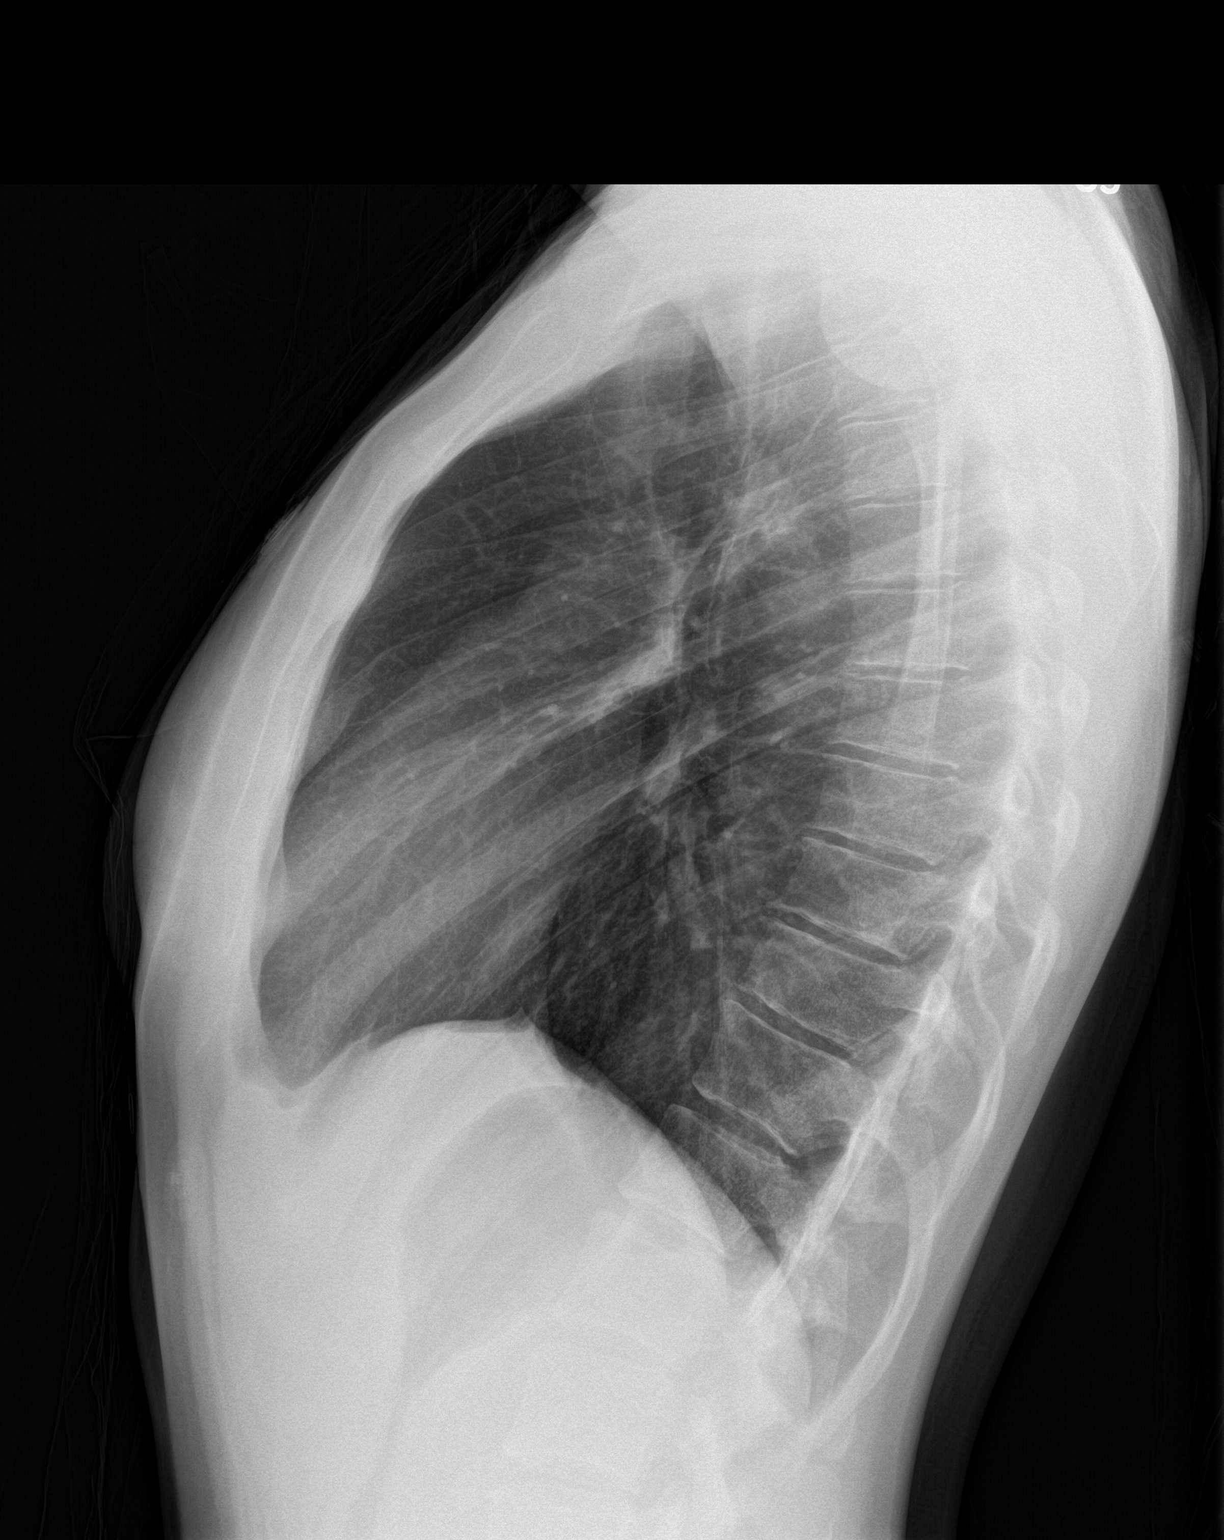

[2 of 2 positions shown; findings below may reference images not displayed]

FINDINGS: The heart size and mediastinal contours are within normal limits.
Both lungs are clear. No pneumothorax or pleural effusion is noted.
The visualized skeletal structures are unremarkable.
IMPRESSION: No acute cardiopulmonary abnormality seen.

## 2015-12-13 ENCOUNTER — Ambulatory Visit: Payer: 59 | Admitting: Physician Assistant

## 2018-09-22 ENCOUNTER — Ambulatory Visit
Admission: EM | Admit: 2018-09-22 | Discharge: 2018-09-22 | Disposition: A | Discharge status: Home / Self Care | Payer: 59 | Attending: Urgent Care | Admitting: Urgent Care

## 2018-09-22 ENCOUNTER — Other Ambulatory Visit: Payer: Self-pay

## 2018-09-22 DIAGNOSIS — L03115 Cellulitis of right lower limb: Secondary | ICD-10-CM | POA: Diagnosis not present

## 2018-09-22 DIAGNOSIS — T148XXA Other injury of unspecified body region, initial encounter: Secondary | ICD-10-CM | POA: Diagnosis not present

## 2018-09-22 MED ORDER — SULFAMETHOXAZOLE-TRIMETHOPRIM 800-160 MG PO TABS: 1.0000 | ORAL_TABLET | Freq: Two times a day (BID) | ORAL | 0 refills | Status: AC

## 2018-09-22 NOTE — Discharge Instructions (Signed)
It was very nice seeing you today in clinic. Thank you for entrusting me with your care.   Please utilize the medications that we discussed. Your prescriptions have been called in to your pharmacy. May use cool compresses to help soothe area. DO NOT rupture blister. May use Tylenol and/or Ibuprofen as needed for pain/fever.   Make arrangements to follow up with your regular doctor in 1 week for re-evaluation if not improving. If your symptoms/condition worsens, please seek follow up care either here or in the ER. Please remember, our [place] providers are "right here with you" when you need [place].   Again, it was my pleasure to take care of you today. Thank you for choosing our clinic. I hope that you start to feel better quickly.   Honor Loh, MSN, APRN, FNP-C, CEN Advanced Practice Provider [facility]

## 2018-09-22 NOTE — ED Triage Notes (Signed)
Patient complains of blister to her right ankle. Patient states that she thought it was a mosquito bite and has since developed in to a blister and has redness surrounding the area.

## 2018-09-23 NOTE — ED Provider Notes (Signed)
Linda Lopez   Name: Linda Lopez DOB: 07/28/1985 MRN: [id number] CSN: [id number] PCP: Patient, No Pcp Per  Arrival date and time:  09/22/18 1534  Chief Complaint:  Blister   NOTE: Prior to seeing the patient today, I have reviewed the triage nursing documentation and vital signs. Clinical staff has updated patient's PMH/PSHx, current medication list, and drug allergies/intolerances to ensure comprehensive history available to assist in medical decision making.   History:   HPI: Linda Lopez is a 33 y.o. female who presents today with complaints of an area of skin irritation to the distal aspect of her RIGHT lower extremity. Area declared yesterday. Patient denies any thermal injuries, known trauma, or known insect bites. Patient reports that she initially thought it was a mosquito bite as area was small, raised, and pruritic. Area quickly progressed into a fluid filled blister. Throughout the day today, patient has developed increased peri-wound erythema and associated pain. She advises that the pain is worse when she ambulates. Patient is employed in a nearby [employer], [employer] practice and was sent to urgent care for further evaluation and antimicrobial therapy.    History reviewed. No pertinent past medical history.  Past Surgical History:  Procedure Laterality Date  . APPENDECTOMY    . LAPAROSCOPIC APPENDECTOMY      History reviewed. No pertinent family history.  Social History   Tobacco Use  . Smoking status: Never Smoker  . Smokeless tobacco: Never Used  Substance Use Topics  . Alcohol use: Yes    Comment: occasionally  . Drug use: No    There are no active problems to display for this patient.   Home Medications:    Current Meds  Medication Sig  . TRI-PREVIFEM 0.18/0.215/0.25 MG-35 MCG tablet Take 1 tablet by mouth daily.    Allergies:   Diflucan [fluconazole]  Review of Systems (ROS): Review of Systems  Constitutional: Negative for chills and fever.   Respiratory: Negative for cough and shortness of breath.   Cardiovascular: Negative for chest pain and palpitations.  Musculoskeletal: Positive for gait problem (2/2 pain in RLE).  Skin: Positive for color change and wound.     Vital Signs: Today's Vitals   09/22/18 1600 09/22/18 1602 09/22/18 1621  BP:  134/90   Pulse:  78   Resp:  16   Temp:  98.5 F (36.9 C)   TempSrc:  Oral   SpO2:  100%   Weight: 146 lb (66.2 kg)    Height: 5\' 6"  (1.676 m)    PainSc: 2   2     Physical Exam: Physical Exam  Constitutional: She is oriented to person, place, and time and well-developed, well-nourished, and in no distress.  HENT:  Head: Normocephalic and atraumatic.  Mouth/Throat: Mucous membranes are normal.  Cardiovascular: Normal rate, regular rhythm, normal heart sounds and intact distal pulses.  Pulmonary/Chest: Effort normal and breath sounds normal.  Neurological: She is alert and oriented to person, place, and time. Gait normal. GCS score is 15.  Skin: Skin is warm and dry.  Intact fluid filled blister to distal RIGHT lower extremity; just above ankle. Fluid (yellow) appears serous. (+) peri-wound erythema and tenderness. No increased warmth.  Psychiatric: Mood, memory, affect and judgment normal.  Nursing note and vitals reviewed.   Urgent Care Treatments / Results:   LABS: PLEASE NOTE: all labs that were ordered this encounter are listed, however only abnormal results are displayed. Labs Reviewed - No data to display  EKG: -None  RADIOLOGY: No  results found.  PROCEDURES: Procedures  MEDICATIONS RECEIVED THIS VISIT: Medications - No data to display  PERTINENT CLINICAL COURSE NOTES/UPDATES:   Initial Impression / Assessment and Plan / Urgent Care Course:  Pertinent labs & imaging results that were available during my care of the patient were personally reviewed by me and considered in my medical decision making (see lab/imaging section of note for values and  interpretations).  Linda Lopez is a 33 y.o. female who presents to [facility] today with complaints of Blister  Patient overall well appearing and in no acute distress today in clinic. Exam as per above. Fluid filled blister to distal RIGHT lower extremity. Lower leg with increased erythema, tenderness, and slight swelling. Etiology unknown as patient denies known injury/trauma, thermal injuries, or insect bites. Given progression of area over night, concern for infection is high. Mild cellulitic changes already present. Will cover with a 5 day course of SMZ-TMP. Patient advised that she could use cool compresses to soothe area. She was advised to avoid rupturing the blister, as this could increase her risk of infection. Discussed that once area ruptures, topical application of TAO will help speed healing. She will monitor for signs and symptoms of infection, which would include increased redness, swelling, streaking, drainage, pain, and the development of a fever.  May use Tylenol and/or Ibuprofen as needed for pain. Patient agrees to RTC for any concerns or changes in appearance of wound/leg.   Discussed follow up with primary care physician in 1 week for re-evaluation. I have reviewed the follow up and strict return precautions for any new or worsening symptoms. Patient is aware of symptoms that would be deemed urgent/emergent, and would thus require further evaluation either here or in the emergency department. At the time of discharge, she verbalized understanding and consent with the discharge plan as it was reviewed with her. All questions were fielded by provider and/or clinic staff prior to patient discharge.    Final Clinical Impressions / Urgent Care Diagnoses:   Final diagnoses:  Blister  Cellulitis of right lower extremity    New Prescriptions:  [place] Controlled Substance Registry consulted? Not Applicable  Meds ordered this encounter  Medications  .  sulfamethoxazole-trimethoprim (BACTRIM DS) 800-160 MG tablet    Sig: Take 1 tablet by mouth 2 (two) times daily for 5 days.    Dispense:  10 tablet    Refill:  0    Recommended Follow up Care:  Patient encouraged to follow up with the following provider within the specified time frame, or sooner as dictated by the severity of her symptoms. As always, she was instructed that for any urgent/emergent care needs, she should seek care either here or in the emergency department for more immediate evaluation.  Follow-up Information    PCP In 1 week.   Why: General reassessment of symptoms if not improving        NOTE: This note was prepared using Lobbyist along with smaller Company secretary. Despite my best ability to proofread, there is the potential that transcriptional errors may still occur from this process, and are completely unintentional.     Karen Kitchens, NP 09/23/18 1720

## 2020-06-01 ENCOUNTER — Encounter: Payer: Self-pay | Admitting: Obstetrics and Gynecology

## 2020-06-01 ENCOUNTER — Other Ambulatory Visit: Payer: Self-pay

## 2020-06-01 ENCOUNTER — Other Ambulatory Visit (HOSPITAL_COMMUNITY)
Admission: RE | Admit: 2020-06-01 | Discharge: 2020-06-01 | Disposition: A | Discharge status: Home / Self Care | Payer: 59 | Source: Ambulatory Visit | Attending: Obstetrics and Gynecology | Admitting: Obstetrics and Gynecology

## 2020-06-01 ENCOUNTER — Ambulatory Visit (INDEPENDENT_AMBULATORY_CARE_PROVIDER_SITE_OTHER): Payer: 59 | Admitting: Obstetrics and Gynecology

## 2020-06-01 VITALS — BP 153/88 | HR 66 | Ht 66.0 in | Wt 153.1 lb

## 2020-06-01 DIAGNOSIS — Z01419 Encounter for gynecological examination (general) (routine) without abnormal findings: Secondary | ICD-10-CM | POA: Diagnosis not present

## 2020-06-01 DIAGNOSIS — Z124 Encounter for screening for malignant neoplasm of cervix: Secondary | ICD-10-CM

## 2020-06-01 NOTE — Addendum Note (Signed)
Addended by: Dorian Pod on: 06/01/2020 11:25 AM   Modules accepted: Orders

## 2020-06-01 NOTE — Progress Notes (Signed)
HPI:      [name] is a 35 y.o. No obstetric history on file. who LMP was Patient's last menstrual period was 05/11/2020 (approximate).  Subjective:   She presents today for her annual examination.  She has not had a Pap smear in more than 5 years.  Reports previous normal Pap. She states that she has recently noticed a bulge/lump in the posterior vagina.  It is nontender.  She says she mainly notices it at the time of bowel movements. She is having normal regular menstrual cycles.  She is not using anything for birth control at this time.  She and her husband are still trying to decide if they want another child.    Hx: The following portions of the patient's history were reviewed and updated as appropriate:             She  has no past medical history on file. She does not have a problem list on file. She  has a past surgical history that includes Laparoscopic appendectomy and Appendectomy. Her family history includes Hypertension in her father. She  reports that she has never smoked. She has never used smokeless tobacco. She reports current alcohol use. She reports that she does not use drugs. She has a current medication list which includes the following prescription(s): [DISCONTINUED] omeprazole. She is allergic to diflucan [fluconazole].       Review of Systems:  Review of Systems  Constitutional: Denied constitutional symptoms, night sweats, recent illness, fatigue, fever, insomnia and weight loss.  Eyes: Denied eye symptoms, eye pain, photophobia, vision change and visual disturbance.  Ears/Nose/Throat/Neck: Denied ear, nose, throat or neck symptoms, hearing loss, nasal discharge, sinus congestion and sore throat.  Cardiovascular: Denied cardiovascular symptoms, arrhythmia, chest pain/pressure, edema, exercise intolerance, orthopnea and palpitations.  Respiratory: Denied pulmonary symptoms, asthma, pleuritic pain, productive sputum, cough, dyspnea and wheezing.   Gastrointestinal: Denied, gastro-esophageal reflux, melena, nausea and vomiting.  Genitourinary: See HPI for additional information.  Musculoskeletal: Denied musculoskeletal symptoms, stiffness, swelling, muscle weakness and myalgia.  Dermatologic: Denied dermatology symptoms, rash and scar.  Neurologic: Denied neurology symptoms, dizziness, headache, neck pain and syncope.  Psychiatric: Denied psychiatric symptoms, anxiety and depression.  Endocrine: Denied endocrine symptoms including hot flashes and night sweats.   Meds:   Current Outpatient Medications on File Prior to Visit  Medication Sig Dispense Refill  . [DISCONTINUED] omeprazole (PRILOSEC) 20 MG capsule Take 1 capsule (20 mg total) by mouth daily. 30 capsule 0   No current facility-administered medications on file prior to visit.       Upstream - 06/01/20 1005      Pregnancy Intention Screening   Does the patient want to become pregnant in the next year? Unsure    Does the patient's partner want to become pregnant in the next year? Unsure    Would the patient like to discuss contraceptive options today? No      Contraception Wrap Up   Current Method No Method - Other Reason    End Method No Contraception Precautions    Contraception Counseling Provided No          The pregnancy intention screening data noted above was reviewed. Potential methods of contraception were discussed. The patient elected to proceed with No Method - No Contraceptive Precautions.     Objective:     Vitals:   06/01/20 0959  BP: (!) 153/88  Pulse: 66    Filed Weights   06/01/20 0959  Weight: 153  lb 1.6 oz (69.4 kg)              Physical examination General NAD, Conversant  HEENT Atraumatic; Op clear with mmm.  Normo-cephalic. Pupils reactive. Anicteric sclerae  Thyroid/Neck Smooth without nodularity or enlargement. Normal ROM.  Neck Supple.  Skin No rashes, lesions or ulceration. Normal palpated skin turgor. No nodularity.   Breasts: No masses or discharge.  Symmetric.  No axillary adenopathy.  Lungs: Clear to auscultation.No rales or wheezes. Normal Respiratory effort, no retractions.  Heart: NSR.  No murmurs or rubs appreciated. No periferal edema  Abdomen: Soft.  Non-tender.  No masses.  No HSM. No hernia  Extremities: Moves all appropriately.  Normal ROM for age. No lymphadenopathy.  Neuro: Oriented to PPT.  Normal mood. Normal affect.     Pelvic:   Vulva: Normal appearance.  No lesions.  Vagina: No lesions or abnormalities noted.  Support: Normal pelvic support.  Urethra No masses tenderness or scarring.  Meatus Normal size without lesions or prolapse.  Cervix: Normal appearance.  No lesions.  Anus: Normal exam.  No lesions.  Perineum: Normal exam.  No lesions.        Bimanual   Uterus: Normal size.  Non-tender.  Mobile.  AV.  Adnexae: No masses.  Non-tender to palpation.  Cul-de-sac: Negative for abnormality.      Assessment:    No obstetric history on file. There are no problems to display for this patient.    1. Well woman exam with routine gynecological exam     No abnormalities noted on vaginal examination consistent with a lump described.  As patient has some difficulty with constipation occasionally perhaps she had some hard stool in the rectum which caused some distortion of the posterior vagina.   Plan:            1.  Basic Screening Recommendations The basic screening recommendations for asymptomatic women were discussed with the patient during her visit.  The age-appropriate recommendations were discussed with her and the rational for the tests reviewed.  When I am informed by the patient that another primary care physician has previously obtained the age-appropriate tests and they are up-to-date, only outstanding tests are ordered and referrals given as necessary.  Abnormal results of tests will be discussed with her when all of her results are completed.  Routine preventative  health maintenance measures emphasized: Exercise/Diet/Weight control, Tobacco Warnings, Alcohol/Substance use risks and Stress Management Pap performed -blood work today 2.  Patient to inform [place] if she decides upon birth control options  Orders Orders Placed This Encounter  Procedures  . Hemoglobin A1c  . Lipid panel  . TSH    No orders of the defined types were placed in this encounter.           F/U  Return in about 1 year (around 06/01/2021) for Annual Physical.  Elonda Husky, M.D. 06/01/2020 10:36 AM

## 2020-06-05 LAB — CYTOLOGY - PAP
Comment: NEGATIVE
Diagnosis: NEGATIVE
Diagnosis: REACTIVE
High risk HPV: NEGATIVE

## 2020-09-05 ENCOUNTER — Other Ambulatory Visit: Payer: Self-pay

## 2020-09-05 ENCOUNTER — Ambulatory Visit
Admission: RE | Admit: 2020-09-05 | Discharge: 2020-09-05 | Disposition: A | Discharge status: Home / Self Care | Payer: 59 | Source: Ambulatory Visit | Attending: Physician Assistant | Admitting: Physician Assistant

## 2020-09-05 VITALS — BP 154/86 | HR 77 | Temp 99.1°F | Resp 18

## 2020-09-05 DIAGNOSIS — R059 Cough, unspecified: Secondary | ICD-10-CM

## 2020-09-05 DIAGNOSIS — J069 Acute upper respiratory infection, unspecified: Secondary | ICD-10-CM

## 2020-09-05 NOTE — ED Triage Notes (Signed)
Cough, congestion and sore throat x 7 days . Got worse Saturday morning.

## 2020-09-05 NOTE — Discharge Instructions (Addendum)
Your covid test is pending 

## 2020-09-06 LAB — COVID-19, FLU A+B NAA
Influenza A, NAA: NOT DETECTED
Influenza B, NAA: NOT DETECTED
SARS-CoV-2, NAA: NOT DETECTED

## 2020-09-19 ENCOUNTER — Telehealth: Payer: Self-pay

## 2021-06-07 ENCOUNTER — Encounter: Payer: Self-pay | Admitting: Obstetrics and Gynecology

## 2021-06-07 DIAGNOSIS — Z01419 Encounter for gynecological examination (general) (routine) without abnormal findings: Secondary | ICD-10-CM

## 2021-07-31 ENCOUNTER — Encounter: Payer: 59 | Admitting: Obstetrics and Gynecology

## 2021-07-31 DIAGNOSIS — Z01419 Encounter for gynecological examination (general) (routine) without abnormal findings: Secondary | ICD-10-CM

## 2022-04-10 ENCOUNTER — Emergency Department: Payer: 59

## 2022-04-10 ENCOUNTER — Emergency Department
Admission: EM | Admit: 2022-04-10 | Discharge: 2022-04-11 | Disposition: A | Discharge status: Home / Self Care | Payer: 59 | Attending: Student in an Organized Health Care Education/Training Program | Admitting: Student in an Organized Health Care Education/Training Program

## 2022-04-10 ENCOUNTER — Other Ambulatory Visit: Payer: Self-pay

## 2022-04-10 DIAGNOSIS — R1013 Epigastric pain: Secondary | ICD-10-CM

## 2022-04-10 DIAGNOSIS — R109 Unspecified abdominal pain: Secondary | ICD-10-CM | POA: Diagnosis not present

## 2022-04-10 LAB — URINALYSIS, ROUTINE W REFLEX MICROSCOPIC
Bilirubin Urine: NEGATIVE
Glucose, UA: NEGATIVE mg/dL
Hgb urine dipstick: NEGATIVE
Ketones, ur: NEGATIVE mg/dL
Leukocytes,Ua: NEGATIVE
Nitrite: NEGATIVE
Protein, ur: NEGATIVE mg/dL
Specific Gravity, Urine: 1.008 (ref 1.005–1.030)
pH: 7 (ref 5.0–8.0)

## 2022-04-10 LAB — COMPREHENSIVE METABOLIC PANEL
ALT: 20 U/L (ref 0–44)
AST: 27 U/L (ref 15–41)
Albumin: 4.5 g/dL (ref 3.5–5.0)
Alkaline Phosphatase: 66 U/L (ref 38–126)
Anion gap: 9 (ref 5–15)
BUN: 15 mg/dL (ref 6–20)
CO2: 24 mmol/L (ref 22–32)
Calcium: 9.8 mg/dL (ref 8.9–10.3)
Chloride: 103 mmol/L (ref 98–111)
Creatinine, Ser: 0.72 mg/dL (ref 0.44–1.00)
GFR, Estimated: >60 mL/min (ref >60)
Glucose, Bld: 106 mg/dL — ABNORMAL HIGH (ref 70–99)
Potassium: 3.8 mmol/L (ref 3.5–5.1)
Sodium: 136 mmol/L (ref 135–145)
Total Bilirubin: 0.6 mg/dL (ref 0.3–1.2)
Total Protein: 7.8 g/dL (ref 6.5–8.1)

## 2022-04-10 LAB — CBC WITH DIFFERENTIAL/PLATELET
Abs Immature Granulocytes: 0.02 10*3/uL (ref 0.00–0.07)
Basophils Absolute: 0.1 10*3/uL (ref 0.0–0.1)
Basophils Relative: 1 %
Eosinophils Absolute: 0.2 10*3/uL (ref 0.0–0.5)
Eosinophils Relative: 2 %
HCT: 38.8 % (ref 36.0–46.0)
Hemoglobin: 13.3 g/dL (ref 12.0–15.0)
Immature Granulocytes: 0 %
Lymphocytes Relative: 36 %
Lymphs Abs: 3.6 10*3/uL (ref 0.7–4.0)
MCH: 31.8 pg (ref 26.0–34.0)
MCHC: 34.3 g/dL (ref 30.0–36.0)
MCV: 92.8 fL (ref 80.0–100.0)
Monocytes Absolute: 0.9 10*3/uL (ref 0.1–1.0)
Monocytes Relative: 9 %
Neutro Abs: 5.1 10*3/uL (ref 1.7–7.7)
Neutrophils Relative %: 52 %
Platelets: 294 10*3/uL (ref 150–400)
RBC: 4.18 MIL/uL (ref 3.87–5.11)
RDW: 11.3 % — ABNORMAL LOW (ref 11.5–15.5)
WBC: 9.8 10*3/uL (ref 4.0–10.5)
nRBC: 0 % (ref 0.0–0.2)

## 2022-04-10 LAB — PREGNANCY, URINE: Preg Test, Ur: NEGATIVE

## 2022-04-10 LAB — LIPASE, BLOOD: Lipase: 35 U/L (ref 11–51)

## 2022-04-10 MED ORDER — FENTANYL CITRATE PF 50 MCG/ML IJ SOSY
50.0000 ug | PREFILLED_SYRINGE | Freq: Once | INTRAMUSCULAR | Status: AC
  Administered 2022-04-10: 50 ug via INTRAVENOUS
  Filled 2022-04-10: qty 1

## 2022-04-10 MED ORDER — ONDANSETRON HCL 4 MG/2ML IJ SOLN
4.0000 mg | Freq: Once | INTRAMUSCULAR | Status: AC
  Administered 2022-04-10: 4 mg via INTRAVENOUS
  Filled 2022-04-10: qty 2

## 2022-04-10 MED ORDER — LACTATED RINGERS IV BOLUS
1000.0000 mL | Freq: Once | INTRAVENOUS | Status: AC
  Administered 2022-04-10: 1000 mL via INTRAVENOUS

## 2022-04-10 NOTE — ED Triage Notes (Signed)
Pt in via POV, reports epigastric pain w/ radiation through to back.  Reports having intermittent episodes over last couple of days w/ pain increasing today.  Reports some nausea, denies vomiting/diarrhea.  NAD noted at this time.

## 2022-04-10 NOTE — ED Provider Notes (Signed)
[facility] Provider Note    Event Date/Time   First MD Initiated Contact with Patient 04/10/22 2221     (approximate)   History   Abdominal Pain   HPI  Linda Lopez is a 37 y.o. female  with no significant past medical history and as listed in EMR presents to the emergency department for treatment and evaluation of epigastric abdominal pain with nausea x 4 days. Pain was intermittent until today when it became more severe and constant.      Physical Exam   Triage Vital Signs:  Today's Vitals   04/10/22 2226 04/11/22 0026 04/11/22 0214 04/11/22 0225  BP:  123/70 (!) 100/48   Pulse:  76 61   Resp:  16 17   Temp:  98 F (36.7 C)    TempSrc:      SpO2:  100% 100%   Weight: 70.3 kg     Height: 5\' 6"  (1.676 m)     PainSc:    0-No pain   Body mass index is 25.02 kg/m.   Most recent vital signs: Vitals:   04/11/22 0026 04/11/22 0214  BP: 123/70 (!) 100/48  Pulse: 76 61  Resp: 16 17  Temp: 98 F (36.7 C)   SpO2: 100% 100%    General: Awake, no distress.  CV:  Good peripheral perfusion.  Resp:  Normal effort.  Abd:  No distention.  Other:  Epigastric and RUQ tenderness.   ED Results / Procedures / Treatments   Labs (all labs ordered are listed, but only abnormal results are displayed) Labs Reviewed  COMPREHENSIVE METABOLIC PANEL - Abnormal; Notable for the following components:      Result Value   Glucose, Bld 106 (*)    All other components within normal limits  CBC WITH DIFFERENTIAL/PLATELET - Abnormal; Notable for the following components:   RDW 11.3 (*)    All other components within normal limits  URINALYSIS, ROUTINE W REFLEX MICROSCOPIC - Abnormal; Notable for the following components:   Color, Urine STRAW (*)    APPearance CLEAR (*)    All other components within normal limits  LIPASE, BLOOD  PREGNANCY, URINE     EKG  Not indicated.   RADIOLOGY  Pending  PROCEDURES:  Critical Care performed:  No  Procedures   MEDICATIONS ORDERED IN ED:  Medications  lactated ringers bolus 1,000 mL (0 mLs Intravenous Stopped 04/10/22 2350)  fentaNYL (SUBLIMAZE) injection 50 mcg (50 mcg Intravenous Given 04/10/22 2247)  ondansetron (ZOFRAN) injection 4 mg (4 mg Intravenous Given 04/10/22 2247)  famotidine (PEPCID) IVPB 20 mg premix (0 mg Intravenous Stopped 04/11/22 0218)  ondansetron (ZOFRAN) injection 4 mg (4 mg Intravenous Given 04/11/22 0121)  iohexol (OMNIPAQUE) 300 MG/ML solution 100 mL (100 mLs Intravenous Contrast Given 04/11/22 0109)     IMPRESSION / MDM / ASSESSMENT AND PLAN / ED COURSE   I have reviewed the triage note.  Differential diagnosis includes, but is not limited to, acute cholecystitis; cholelithiasis; bowel obstruction; ulcer; pancreatitis  Patient's presentation is most consistent with acute presentation with potential threat to life or bodily function.  37 year old female presents to the emergency department for treatment and evaluation of epigastric pain with nausea. See HPI.  Plan will be to get labs, urine, and US of the RUQ. Patient aware and agreeable to the plan.   Care transitioned to Dr. Beather Arbour who will follow up on [place] and determine appropriate disposition.     FINAL CLINICAL IMPRESSION(S) /  ED DIAGNOSES   Final diagnoses:  Epigastric pain     Rx / DC Orders   ED Discharge Orders          Ordered    sucralfate (CARAFATE) 1 GM/10ML suspension  4 times daily        04/11/22 0132    famotidine (PEPCID) 20 MG tablet  2 times daily        04/11/22 0132             Note:  This document was prepared using Dragon voice recognition software and may include unintentional dictation errors.   Victorino Dike, FNP 04/11/22 1219    Paulette Blanch, MD 04/17/22 [phone]

## 2022-04-10 NOTE — ED Notes (Addendum)
Ultrasound at bedside

## 2022-04-11 ENCOUNTER — Emergency Department: Payer: 59

## 2022-04-11 DIAGNOSIS — R109 Unspecified abdominal pain: Secondary | ICD-10-CM | POA: Diagnosis not present

## 2022-04-11 MED ORDER — FAMOTIDINE IN NACL 20-0.9 MG/50ML-% IV SOLN
20.0000 mg | Freq: Once | INTRAVENOUS | Status: AC
  Administered 2022-04-11: 20 mg via INTRAVENOUS
  Filled 2022-04-11: qty 50

## 2022-04-11 MED ORDER — IOHEXOL 300 MG/ML  SOLN
100.0000 mL | Freq: Once | INTRAMUSCULAR | Status: AC | PRN
  Administered 2022-04-11: 100 mL via INTRAVENOUS

## 2022-04-11 MED ORDER — ONDANSETRON HCL 4 MG/2ML IJ SOLN
4.0000 mg | Freq: Once | INTRAMUSCULAR | Status: AC
  Administered 2022-04-11: 4 mg via INTRAVENOUS
  Filled 2022-04-11: qty 2

## 2022-04-11 MED ORDER — SUCRALFATE 1 GM/10ML PO SUSP: 1.0000 g | Freq: Four times a day (QID) | ORAL | 1 refills | Status: DC

## 2022-04-11 MED ORDER — FAMOTIDINE 20 MG PO TABS: 20.0000 mg | ORAL_TABLET | Freq: Two times a day (BID) | ORAL | 1 refills | Status: DC

## 2022-04-11 NOTE — ED Notes (Signed)
Pt resting quietly on stretcher, family at bedside, no distress noted, updated on plan of care.

## 2022-04-11 NOTE — ED Provider Notes (Signed)
-----------------------------------------   12:48 AM on 04/11/2022 -----------------------------------------   Patient is pending ultrasound results.  Pain currently 3/10, improved.  Will reassess.  ----------------------------------------- 1:02 AM on 04/11/2022 -----------------------------------------   [place] interpreted per Dr. Maryland Pink: Negative right upper quadrant ultrasound.   Examined abdomen with chest diffuse tenderness, maximally epigastrium.  Will administer IV Pepcid, Zofran and proceed with CT abdomen/pelvis.  ----------------------------------------- 1:31 AM on 04/11/2022 -----------------------------------------   CT unremarkable.  Will discharge home on Pepcid, Carafate and refer to GI for outpatient follow-up.  Strict return precautions given.  Patient and family member verbalized understanding and agree with plan of care.   Paulette Blanch, MD 04/11/22 [phone]

## 2022-04-11 NOTE — Discharge Instructions (Addendum)
Start the following medicines daily: Pepcid 20 mg twice daily Carafate 3 times daily with food and at bedtime 2. Eat a bland diet x 1 week.  Avoid fatty, spicy foods and alcohol. 3. Return to the ER for worsening symptoms, persistent vomiting, difficulty breathing or other concerns.

## 2022-04-11 NOTE — ED Notes (Signed)
Pt in CT.

## 2022-04-12 ENCOUNTER — Encounter: Payer: Self-pay | Admitting: Nurse Practitioner

## 2022-05-05 NOTE — Progress Notes (Unsigned)
05/05/2022 Zakari L Kreger [id number] March 08, 1986   CHIEF COMPLAINT: Nausea, upper abdominal pain   HISTORY OF PRESENT ILLNESS: Linda Lopez is a 37 year old female with a past medical history of UTIs otherwise noncontributory. S/P appendectomy. She endorses having "attacks" of nausea with RUQ pain which radiated through to her upper back which can last 2 hours or all night long for the past few years.  She describes having episodes of RUQ pain which radiates to the back which occurs randomly for one day or for a few consecutive days, usually in the evenings, then goes several weeks without an episode. She noticed white chalky colored stools after her most recent 2 "severe episodes".  She has nausea and she sometimes induces vomiting hoping to obtain relief but does not really feel any better after she vomits. No coffee-ground or frank hematemesis.  Last week she had 2 episodes which were not as severe and lasted for 1 or 2 hours.  Infrequent NSAID use.  No alcohol use.  One of her worst episodes associated with chalky colored stools occurred 04/10/2022 in the evening and she presented to the ED for further evaluation.  CBC, CMP and lipase levels were normal.  RUQ sonogram showed a normal gallbladder without evidence of biliary ductal dilatation. CTAP with contrast showed a normal gallbladder without evidence of gallstones, gallbladder wall thickening or biliary ductal dilatation. Stomach, small bowel and colon were normal. She was prescribed Famotidine '20mg'$  po bid and Carafate suspension 1gm po qid without any noticeable improvement.  She denies having any heartburn or dysphagia.  Infrequent NSAID use.  No rectal bleeding or black stools.      Latest Ref Rng & Units 04/10/2022   10:34 PM 02/17/2014    5:27 PM 05/21/2010    5:18 AM  CBC  WBC 4.0 - 10.5 K/uL 9.8  11.3  7.5   Hemoglobin 12.0 - 15.0 g/dL 13.3  13.4  10.7   Hematocrit 36.0 - 46.0 % 38.8  38.4  31.1   Platelets 150 - 400 K/uL  294  260  210        Latest Ref Rng & Units 04/10/2022   10:34 PM 02/17/2014    5:27 PM 05/20/2010    5:28 PM  CMP  Glucose 70 - 99 mg/dL 106  93  87   BUN 6 - 20 mg/dL '15  15  7   '$ Creatinine 0.44 - 1.00 mg/dL 0.72  0.77  0.66   Sodium 135 - 145 mmol/L 136  139  132   Potassium 3.5 - 5.1 mmol/L 3.8  3.7  3.4   Chloride 98 - 111 mmol/L 103  102  99   CO2 22 - 32 mmol/L '24  21  24   '$ Calcium 8.9 - 10.3 mg/dL 9.8  9.9  8.5   Total Protein 6.5 - 8.1 g/dL 7.8   6.8   Total Bilirubin 0.3 - 1.2 mg/dL 0.6   0.8   Alkaline Phos 38 - 126 U/L 66   50   AST 15 - 41 U/L 27   15   ALT 0 - 44 U/L 20   12     RUQ sonogram 04/10/2022:  FINDINGS: Gallbladder: No gallstones or wall thickening visualized. No sonographic Murphy sign noted by sonographer.   Common bile duct: Diameter: 3 mm   Liver: No focal lesion identified. Within normal limits in parenchymal echogenicity. Portal vein is patent on color Doppler imaging with normal direction  of blood flow towards the liver.   IMPRESSION: Negative right upper quadrant ultrasound.  CTAP 04/11/2022 with contrast:  FINDINGS: Lower chest: No acute abnormality.   Hepatobiliary: No focal liver abnormality is seen. No gallstones, gallbladder wall thickening, or biliary dilatation.   Pancreas: Unremarkable. No pancreatic ductal dilatation or surrounding inflammatory changes.   Spleen: Normal in size without focal abnormality.   Adrenals/Urinary Tract: Adrenal glands are within normal limits. Kidneys demonstrate a normal enhancement pattern bilaterally. No renal calculi or obstructive changes are seen. The bladder is well distended.   Stomach/Bowel: No obstructive or inflammatory changes colon are seen. The appendix has been surgically removed. Small bowel and stomach are within normal limits.   Vascular/Lymphatic: No significant vascular findings are present. No enlarged abdominal or pelvic lymph nodes.   Reproductive: Uterus and bilateral  adnexa are unremarkable.   Other: Minimal free fluid is noted in the pelvis likely physiologic in nature.   Musculoskeletal: No acute or significant osseous findings.   IMPRESSION: No acute abnormality noted.   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 Nonsmoker.  No alcohol use.  No drug use.  Family History: Father with history of hypertension.  Maternal grandmother diabetes. Paternal grandfather diabetes.  No known family history of esophageal, gastric or colon cancer.  Allergies  Allergen Reactions   Diflucan [Fluconazole] Swelling and Rash      Outpatient Encounter Medications as of 05/06/2022  Medication Sig   famotidine (PEPCID) 20 MG tablet Take 1 tablet (20 mg total) by mouth 2 (two) times daily.   sucralfate (CARAFATE) 1 GM/10ML suspension Take 10 mLs (1 g total) by mouth 4 (four) times daily.   [DISCONTINUED] omeprazole (PRILOSEC) 20 MG capsule Take 1 capsule (20 mg total) by mouth daily.   No facility-administered encounter medications on file as of 05/06/2022.     REVIEW OF SYSTEMS:  Gen: Denies fever, sweats or chills. No weight loss.  CV: Denies chest pain, palpitations or edema. Resp: Denies cough, shortness of breath of hemoptysis.  GI: See HPI. GU : Denies urinary burning, blood in urine, increased urinary frequency or incontinence. MS: Denies joint pain, muscles aches or weakness. Derm: Denies rash, itchiness, skin lesions or unhealing ulcers. Psych: Denies depression, anxiety, memory loss or confusion. Heme: Denies bruising, easy bleeding. Neuro:  Denies headaches, dizziness or paresthesias. Endo:  Denies any problems with DM, thyroid or adrenal function.  PHYSICAL EXAM: BP 110/70 (BP Location: Left Arm, Patient Position: Sitting, Cuff Size: Normal)   Pulse 68   Ht 5' 6.5" (1.689 m) Comment: height measured without  shoes  Wt 151 lb 8 oz (68.7 kg)   LMP 04/22/2022   BMI 24.09 kg/m  LMP 2 weeks ago, patient denies chance of pregnancy at this time  General: 37 year old female in no acute distress. Head: Normocephalic and atraumatic. Eyes:  Sclerae non-icteric, conjunctive pink. Ears: Normal auditory acuity. Mouth: Dentition intact. No ulcers or lesions.  Neck: Supple, no lymphadenopathy or thyromegaly.  Lungs: Clear bilaterally to auscultation without wheezes, crackles or rhonchi. Heart: Regular rate and rhythm. No murmur, rub or gallop appreciated.  Abdomen: Soft, nondistended. Mild epigastric and RUQ tenderness. No masses. No hepatosplenomegaly. Normoactive bowel sounds x 4 quadrants.  Rectal: Deferred. Musculoskeletal: Symmetrical with no gross deformities. Skin: Warm and dry. No rash or lesions on visible extremities. Extremities: No edema. Neurological: Alert oriented x  4, no focal deficits.  Psychological:  Alert and cooperative. Normal mood and affect.  ASSESSMENT AND PLAN:  37 year old female with episodic RUQ pain which radiates through to the back and between the shoulder blades with nausea +/- self induced vomiting.  Acholic stools x 2.  ED evaluation 1/31 -04/11/2022 showed a normal RUQ sonogram and CTAP.  Normal LFTs and lipase levels.  Her symptoms are highly suspicious for biliary etiology. -CCK HIDA scan to rule out gallbladder dyskinesia -Labs to include CBC, CMP and CRP to be done at time of next attack and consider MRI/MRCP -Referral to general surgery to determine if her symptoms warrant an elective cholecystectomy -Consider EGD to rule out any duodenal pathology to explain her symptoms -Continue Famotidine as previously prescribed for now -Avoid fatty foods -Hyoscyamine 0.125 mg 1 tab sublingual every 6-8 hours as needed -Patient to go to the ED if she develops severe upper abdominal or upper back pain    CC:  No ref. provider found

## 2022-05-06 ENCOUNTER — Ambulatory Visit: Payer: 59 | Admitting: Nurse Practitioner

## 2022-05-06 ENCOUNTER — Encounter: Payer: Self-pay | Admitting: Nurse Practitioner

## 2022-05-06 VITALS — BP 110/70 | HR 68 | Ht 66.5 in | Wt 151.5 lb

## 2022-05-06 DIAGNOSIS — R1011 Right upper quadrant pain: Secondary | ICD-10-CM | POA: Diagnosis not present

## 2022-05-06 DIAGNOSIS — R110 Nausea: Secondary | ICD-10-CM | POA: Diagnosis not present

## 2022-05-06 MED ORDER — HYOSCYAMINE SULFATE 0.125 MG SL SUBL: 0.1250 mg | SUBLINGUAL_TABLET | Freq: Four times a day (QID) | SUBLINGUAL | 0 refills | Status: DC | PRN

## 2022-05-06 NOTE — Patient Instructions (Signed)
You have been scheduled for a HIDA scan at [facility] (Entrance C) on 05/20/22 . Please arrive 30 minutes prior to your scheduled appointment at  8 am. Make certain not to have anything to eat or drink at least 6 hours prior to your test. Should this appointment date or time not work well for you, please call radiology scheduling at [phone].  _____________________________________________________________________  Continue Famotidine 1 by mouth daily.  Come to our lab on the basement level the next time you are experiencing an attack.  Go to the Ed if you have any severe abdominal pain or upper back pain.  Due to recent changes in healthcare laws, you may see the results of your imaging and laboratory studies on MyChart before your provider has had a chance to review them.  We understand that in some cases there may be results that are confusing or concerning to you. Not all laboratory results come back in the same time frame and the provider may be waiting for multiple results in order to interpret others.  Please give [place] 48 hours in order for your provider to thoroughly review all the results before contacting the office for clarification of your results.   Thank you for trusting me with your gastrointestinal care!   Carl Best, CRNP

## 2022-05-07 NOTE — Progress Notes (Signed)
____________________________________________________________  Attending physician addendum:  Thank you for sending this case to me. I have reviewed the entire note and agree with the plan.  I agree that symptoms are highly suspicious for biliary cause, and normal LFTs without biliary ductal dilatation argues against CBD stones. No upper endoscopy at this point.  Await HIDA scan results and general surgery consult.  Wilfrid Lund, MD  ____________________________________________________________

## 2022-05-20 ENCOUNTER — Encounter (HOSPITAL_COMMUNITY): Payer: 59

## 2022-05-30 DIAGNOSIS — K828 Other specified diseases of gallbladder: Secondary | ICD-10-CM | POA: Diagnosis not present

## 2022-06-28 ENCOUNTER — Encounter (HOSPITAL_COMMUNITY): Admission: RE | Admit: 2022-06-28 | Payer: 59 | Source: Ambulatory Visit

## 2022-07-17 ENCOUNTER — Ambulatory Visit (INDEPENDENT_AMBULATORY_CARE_PROVIDER_SITE_OTHER): Payer: 59 | Admitting: Obstetrics and Gynecology

## 2022-07-17 ENCOUNTER — Encounter: Payer: Self-pay | Admitting: Obstetrics and Gynecology

## 2022-07-17 VITALS — BP 137/81 | HR 76 | Ht 66.5 in | Wt 156.0 lb

## 2022-07-17 DIAGNOSIS — Z01419 Encounter for gynecological examination (general) (routine) without abnormal findings: Secondary | ICD-10-CM | POA: Diagnosis not present

## 2022-07-17 DIAGNOSIS — Z30011 Encounter for initial prescription of contraceptive pills: Secondary | ICD-10-CM

## 2022-07-17 DIAGNOSIS — N920 Excessive and frequent menstruation with regular cycle: Secondary | ICD-10-CM

## 2022-07-17 MED ORDER — DESOGESTREL-ETHINYL ESTRADIOL 0.15-0.02/0.01 MG (21/5) PO TABS: 1.0000 | ORAL_TABLET | Freq: Every day | ORAL | 3 refills | Status: DC

## 2022-07-17 NOTE — Progress Notes (Signed)
HPI:      [name] is a 37 y.o. G1P1001 who LMP was Patient's last menstrual period was 07/13/2022 (exact date).  Subjective:   She presents today for her annual examination.  She reports that she went off OCPs because she thought her blood pressure was increasing.  Since going off OCPs she has noticed very heavy menstrual bleeding each month.  She has normal regular cycles but significant bleeding and cramping.  She is interested in something for both cycle control and birth control.  Currently using rhythm and withdrawal method for birth control.    Hx: The following portions of the patient's history were reviewed and updated as appropriate:             She  has no past medical history on file. She does not have any pertinent problems on file. She  has a past surgical history that includes Laparoscopic appendectomy and Appendectomy. Her family history includes Cholelithiasis in her father and paternal grandfather; Diabetes in her maternal grandmother and paternal grandfather; Diverticulosis in her paternal grandfather; Gallbladder disease in her mother; Hypertension in her father and paternal grandfather; Kidney cancer in her maternal grandfather. She  reports that she has never smoked. She has never used smokeless tobacco. She reports current alcohol use. She reports that she does not use drugs. She has a current medication list which includes the following prescription(s): desogestrel-ethinyl estradiol and [DISCONTINUED] omeprazole. She is allergic to diflucan [fluconazole].       Review of Systems:  Review of Systems  Constitutional: Denied constitutional symptoms, night sweats, recent illness, fatigue, fever, insomnia and weight loss.  Eyes: Denied eye symptoms, eye pain, photophobia, vision change and visual disturbance.  Ears/Nose/Throat/Neck: Denied ear, nose, throat or neck symptoms, hearing loss, nasal discharge, sinus congestion and sore throat.  Cardiovascular: Denied  cardiovascular symptoms, arrhythmia, chest pain/pressure, edema, exercise intolerance, orthopnea and palpitations.  Respiratory: Denied pulmonary symptoms, asthma, pleuritic pain, productive sputum, cough, dyspnea and wheezing.  Gastrointestinal: Denied, gastro-esophageal reflux, melena, nausea and vomiting.  Genitourinary: See HPI for additional information.  Musculoskeletal: Denied musculoskeletal symptoms, stiffness, swelling, muscle weakness and myalgia.  Dermatologic: Denied dermatology symptoms, rash and scar.  Neurologic: Denied neurology symptoms, dizziness, headache, neck pain and syncope.  Psychiatric: Denied psychiatric symptoms, anxiety and depression.  Endocrine: Denied endocrine symptoms including hot flashes and night sweats.   Meds:   Current Outpatient Medications on File Prior to Visit  Medication Sig Dispense Refill   [DISCONTINUED] omeprazole (PRILOSEC) 20 MG capsule Take 1 capsule (20 mg total) by mouth daily. 30 capsule 0   No current facility-administered medications on file prior to visit.     Objective:     Vitals:   07/17/22 1324  BP: 137/81  Pulse: 76    Filed Weights   07/17/22 1324  Weight: 156 lb (70.8 kg)              Physical examination General NAD, Conversant  HEENT Atraumatic; Op clear with mmm.  Normo-cephalic. Pupils reactive. Anicteric sclerae  Thyroid/Neck Smooth without nodularity or enlargement. Normal ROM.  Neck Supple.  Skin No rashes, lesions or ulceration. Normal palpated skin turgor. No nodularity.  Breasts: No masses or discharge.  Symmetric.  No axillary adenopathy.  Lungs: Clear to auscultation.No rales or wheezes. Normal Respiratory effort, no retractions.  Heart: NSR.  No murmurs or rubs appreciated. No peripheral edema  Abdomen: Soft.  Non-tender.  No masses.  No HSM. No hernia  Extremities: Moves all appropriately.  Normal ROM for age. No lymphadenopathy.  Neuro: Oriented to PPT.  Normal mood. Normal affect.     Pelvic:  Declined     Assessment:    G1P1001 Patient Active Problem List   Diagnosis Date Noted   RUQ pain 05/06/2022     1. Well woman exam with routine gynecological exam   2. Menorrhagia with regular cycle   3. Initiation of OCP (BCP)        Plan:            1.  Basic Screening Recommendations The basic screening recommendations for asymptomatic women were discussed with the patient during her visit.  The age-appropriate recommendations were discussed with her and the rational for the tests reviewed.  When I am informed by the patient that another primary care physician has previously obtained the age-appropriate tests and they are up-to-date, only outstanding tests are ordered and referrals given as necessary.  Abnormal results of tests will be discussed with her when all of her results are completed.  Routine preventative health maintenance measures emphasized: Exercise/Diet/Weight control, Tobacco Warnings, Alcohol/Substance use risks and Stress Management 2.  Patient would like to start OCPs for better cycle control and help with her menorrhagia. OCPs The risks /benefits of OCPs have been explained to the patient in detail.  Product literature has been given to her where appropriate.  I have instructed her in the use of OCPs.  I have explained to the patient that OCPs are not as effective for birth control during the first month of use, and that another form of contraception should be used during this time.  Both first-day start and Sunday start have been explained.  The risks and benefits of each was discussed.  She has been made aware of  the fact that in rare circumstances, other medications may affect the efficacy of OCPs.  I have answered all of her questions, and I believe that she has an understanding of the effectiveness and use of OCPs. Will start Mircette.  Orders Orders Placed This Encounter  Procedures   Basic metabolic panel   CBC   TSH   Lipid panel   Hemoglobin A1c      Meds ordered this encounter  Medications   desogestrel-ethinyl estradiol (MIRCETTE) 0.15-0.02/0.01 MG (21/5) tablet    Sig: Take 1 tablet by mouth at bedtime.    Dispense:  84 tablet    Refill:  3            F/U  Return in about 1 year (around 07/17/2023) for Annual Physical.  Elonda Husky, M.D. 07/17/2022 2:02 PM

## 2022-07-17 NOTE — Progress Notes (Signed)
Patients presents for annual exam today. She states having very heavy cycles along with moderate to severe cramping. Patient is not using anything for birth control at this time, history of OCP use Patient is up to date on pap smear. Annual labs are ordered, will return when fasting. She states no other questions or concerns at this time.

## 2022-07-30 DIAGNOSIS — R030 Elevated blood-pressure reading, without diagnosis of hypertension: Secondary | ICD-10-CM | POA: Diagnosis not present

## 2022-07-30 DIAGNOSIS — J020 Streptococcal pharyngitis: Secondary | ICD-10-CM | POA: Diagnosis not present

## 2022-07-30 DIAGNOSIS — Z6823 Body mass index (BMI) 23.0-23.9, adult: Secondary | ICD-10-CM | POA: Diagnosis not present

## 2022-08-13 DIAGNOSIS — L2389 Allergic contact dermatitis due to other agents: Secondary | ICD-10-CM | POA: Diagnosis not present

## 2022-08-13 DIAGNOSIS — Z6825 Body mass index (BMI) 25.0-25.9, adult: Secondary | ICD-10-CM | POA: Diagnosis not present

## 2022-08-15 ENCOUNTER — Other Ambulatory Visit: Payer: 59

## 2022-08-15 ENCOUNTER — Telehealth: Payer: Self-pay | Admitting: Obstetrics and Gynecology

## 2022-08-15 NOTE — Telephone Encounter (Signed)
Reached out to pt to reschedule fasting labs (ordered by Dr. Logan Bores) that were scheduled on 08/15/2022 at 8:20.  Could not leave a message bc the mailbox was full.

## 2022-08-16 ENCOUNTER — Encounter: Payer: Self-pay | Admitting: Obstetrics and Gynecology

## 2022-08-16 NOTE — Telephone Encounter (Signed)
Reached out to pt (2x) to reschedule fasting labs (ordered by Dr. Logan Bores) that were scheduled on 08/15/2022 at 8:20.  Could not leave a message bc mailbox was full.  Will send  MyChart letter.

## 2022-08-23 DIAGNOSIS — A389 Scarlet fever, uncomplicated: Secondary | ICD-10-CM | POA: Diagnosis not present

## 2022-08-23 DIAGNOSIS — L42 Pityriasis rosea: Secondary | ICD-10-CM | POA: Diagnosis not present

## 2022-08-29 DIAGNOSIS — T7840XD Allergy, unspecified, subsequent encounter: Secondary | ICD-10-CM | POA: Diagnosis not present

## 2022-08-29 DIAGNOSIS — B370 Candidal stomatitis: Secondary | ICD-10-CM | POA: Diagnosis not present

## 2022-09-19 DIAGNOSIS — Z79899 Other long term (current) drug therapy: Secondary | ICD-10-CM | POA: Diagnosis not present

## 2022-09-19 DIAGNOSIS — Z111 Encounter for screening for respiratory tuberculosis: Secondary | ICD-10-CM | POA: Diagnosis not present

## 2022-09-19 DIAGNOSIS — L400 Psoriasis vulgaris: Secondary | ICD-10-CM | POA: Diagnosis not present

## 2022-10-08 DIAGNOSIS — L404 Guttate psoriasis: Secondary | ICD-10-CM | POA: Diagnosis not present

## 2022-10-08 DIAGNOSIS — D2272 Melanocytic nevi of left lower limb, including hip: Secondary | ICD-10-CM | POA: Diagnosis not present

## 2022-10-08 DIAGNOSIS — D2261 Melanocytic nevi of right upper limb, including shoulder: Secondary | ICD-10-CM | POA: Diagnosis not present

## 2022-10-08 DIAGNOSIS — D2262 Melanocytic nevi of left upper limb, including shoulder: Secondary | ICD-10-CM | POA: Diagnosis not present

## 2022-10-08 DIAGNOSIS — D225 Melanocytic nevi of trunk: Secondary | ICD-10-CM | POA: Diagnosis not present

## 2022-10-08 DIAGNOSIS — Z872 Personal history of diseases of the skin and subcutaneous tissue: Secondary | ICD-10-CM | POA: Diagnosis not present

## 2022-10-08 DIAGNOSIS — D2271 Melanocytic nevi of right lower limb, including hip: Secondary | ICD-10-CM | POA: Diagnosis not present

## 2022-12-02 ENCOUNTER — Ambulatory Visit
Admission: EM | Admit: 2022-12-02 | Discharge: 2022-12-02 | Disposition: A | Discharge status: Home / Self Care | Payer: 59 | Attending: Nurse Practitioner | Admitting: Nurse Practitioner

## 2022-12-02 DIAGNOSIS — N3000 Acute cystitis without hematuria: Secondary | ICD-10-CM | POA: Diagnosis not present

## 2022-12-02 HISTORY — DX: Guttate psoriasis: L40.4

## 2022-12-02 LAB — POCT URINALYSIS DIP (MANUAL ENTRY)
Bilirubin, UA: NEGATIVE
Blood, UA: NEGATIVE
Glucose, UA: 100 mg/dL — AB
Ketones, POC UA: NEGATIVE mg/dL
Nitrite, UA: POSITIVE — AB
Spec Grav, UA: 1.02 (ref 1.010–1.025)
Urobilinogen, UA: 1 E.U./dL
pH, UA: 6.5 (ref 5.0–8.0)

## 2022-12-02 MED ORDER — SULFAMETHOXAZOLE-TRIMETHOPRIM 800-160 MG PO TABS: 1.0000 | ORAL_TABLET | Freq: Two times a day (BID) | ORAL | 0 refills | Status: AC

## 2022-12-02 NOTE — Discharge Instructions (Signed)
Urinalysis shows that you do have a urinary tract infection.  A urine culture has been ordered to ensure you are being treated with the appropriate antibiotic.  If the medication needs to be changed, you will be contacted. Take medication as prescribed. Increase fluids and allow for plenty of rest.  Try to drink at least 8-10 8 ounce glasses of water daily while symptoms persist. Try to develop a schedule that will allow you to urinate at least every 2 hours. Avoid caffeine such as tea, soda, and coffee while symptoms persist. If symptoms appear to be worsening while taking the antibiotic and you did not develop new symptoms such as fever, chills, or worsening urinary symptoms, please go to the emergency department immediately for further evaluation. If symptoms continue to recur despite use of antibiotics, please follow-up with your primary care physician or with urology for further evaluation. Follow-up as needed.

## 2022-12-02 NOTE — ED Provider Notes (Signed)
[facility]    CSN: [id number] Arrival date & time: 12/02/22  0825      History   Chief Complaint No chief complaint on file.   HPI Linda Lopez is a 37 y.o. female.   The history is provided by the patient.   Patient presents for complaints of pain with urination, low back pain, urinary frequency/urgency, and headache.  Symptoms started approximately 1 day ago.  Patient reports she did have a UTI approximately 4 weeks ago and was treated with Keflex.  She states symptoms did improve.  Patient denies fever, chills, chest pain, abdominal pain, nausea, vomiting, hematuria, decreased urine stream, flank pain, or vaginal symptoms.  Patient reports that she was seen in the past by urology for recurrent UTIs and that she was treated with nitrofurantoin, which does not help her symptoms.    Past Medical History:  Diagnosis Date   Guttate psoriasis     Patient Active Problem List   Diagnosis Date Noted   RUQ pain 05/06/2022    Past Surgical History:  Procedure Laterality Date   APPENDECTOMY     LAPAROSCOPIC APPENDECTOMY      OB History     Gravida  1   Para  1   Term  1   Preterm      AB      Living  1      SAB      IAB      Ectopic      Multiple      Live Births  1            Home Medications    Prior to Admission medications   Medication Sig Start Date End Date Taking? Authorizing Provider  sulfamethoxazole-trimethoprim (BACTRIM DS) 800-160 MG tablet Take 1 tablet by mouth 2 (two) times daily for 7 days. 12/02/22 12/09/22 Yes Niurka Benecke-Warren, Sadie Haber, NP  desogestrel-ethinyl estradiol (MIRCETTE) 0.15-0.02/0.01 MG (21/5) tablet Take 1 tablet by mouth at bedtime. 07/17/22   Linzie Collin, MD  omeprazole (PRILOSEC) 20 MG capsule Take 1 capsule (20 mg total) by mouth daily. 02/17/14 09/22/18  Toy Cookey, MD    Family History Family History  Problem Relation Age of Onset   Gallbladder disease Mother    Hypertension Father     Cholelithiasis Father    Diabetes Maternal Grandmother    Kidney cancer Maternal Grandfather    Diabetes Paternal Grandfather    Hypertension Paternal Grandfather    Cholelithiasis Paternal Grandfather    Diverticulosis Paternal Grandfather     Social History Social History   Tobacco Use   Smoking status: Never   Smokeless tobacco: Never  Vaping Use   Vaping status: Never Used  Substance Use Topics   Alcohol use: Yes    Comment: occasionally   Drug use: No     Allergies   Diflucan [fluconazole]   Review of Systems Review of Systems Per HPI  Physical Exam Triage Vital Signs ED Triage Vitals [12/02/22 0920]  Encounter Vitals Group     BP 131/83     Systolic BP Percentile      Diastolic BP Percentile      Pulse Rate 65     Resp 13     Temp 98.2 F (36.8 C)     Temp Source Oral     SpO2 98 %     Weight      Height      Head Circumference  Peak Flow      Pain Score 3     Pain Loc      Pain Education      Exclude from Growth Chart    No data found.  Updated Vital Signs BP 131/83 (BP Location: Right Arm)   Pulse 65   Temp 98.2 F (36.8 C) (Oral)   Resp 13   LMP 11/25/2022 (Within Days)   SpO2 98%   Visual Acuity Right Eye Distance:   Left Eye Distance:   Bilateral Distance:    Right Eye Near:   Left Eye Near:    Bilateral Near:     Physical Exam Vitals and nursing note reviewed.  Constitutional:      General: She is not in acute distress.    Appearance: Normal appearance.  HENT:     Head: Normocephalic.  Eyes:     Extraocular Movements: Extraocular movements intact.     Pupils: Pupils are equal, round, and reactive to light.  Cardiovascular:     Rate and Rhythm: Normal rate and regular rhythm.     Pulses: Normal pulses.     Heart sounds: Normal heart sounds.  Pulmonary:     Effort: Pulmonary effort is normal. No respiratory distress.     Breath sounds: Normal breath sounds. No stridor. No wheezing, rhonchi or rales.  Abdominal:      General: Abdomen is flat. Bowel sounds are normal.     Palpations: Abdomen is soft.     Tenderness: There is abdominal tenderness in the suprapubic area and left lower quadrant. There is right CVA tenderness and left CVA tenderness.  Musculoskeletal:     Cervical back: Normal range of motion.  Lymphadenopathy:     Cervical: No cervical adenopathy.  Skin:    General: Skin is warm and dry.  Neurological:     General: No focal deficit present.     Mental Status: She is alert and oriented to person, place, and time.  Psychiatric:        Mood and Affect: Mood normal.        Behavior: Behavior normal.      UC Treatments / Results  Labs (all labs ordered are listed, but only abnormal results are displayed) Labs Reviewed  POCT URINALYSIS DIP (MANUAL ENTRY) - Abnormal; Notable for the following components:      Result Value   Color, UA orange (*)    Glucose, UA =100 (*)    Protein Ur, POC trace (*)    Nitrite, UA Positive (*)    Leukocytes, UA Trace (*)    All other components within normal limits  URINE CULTURE    EKG   Radiology No results found.  Procedures Procedures (including critical care time)  Medications Ordered in UC Medications - No data to display  Initial Impression / Assessment and Plan / UC Course  I have reviewed the triage vital signs and the nursing notes.  Pertinent labs & imaging results that were available during my care of the patient were reviewed by me and considered in my medical decision making (see chart for details).  The patient is well-appearing, she is in no acute distress, vital signs are stable.  Urinalysis is positive for a UTI.  Urine culture is pending.  Will treat with Bactrim DS 800/160 mg tablets for the next 7 days.  Supportive care recommendations were provided and discussed with the patient to include over-the-counter analgesics, increasing her fluid intake, avoiding caffeine, and develop a toileting schedule.  Patient was  given strict ER follow-up precautions.  Patient was advised to follow-up with her PCP or with urology if symptoms continue to occur despite medications.  Patient is in agreement with this plan of care and verbalizes understanding.  All questions were answered.  Patient is stable for discharge.  Final Clinical Impressions(s) / UC Diagnoses   Final diagnoses:  Acute cystitis without hematuria     Discharge Instructions      Urinalysis shows that you do have a urinary tract infection.  A urine culture has been ordered to ensure you are being treated with the appropriate antibiotic.  If the medication needs to be changed, you will be contacted. Take medication as prescribed. Increase fluids and allow for plenty of rest.  Try to drink at least 8-10 8 ounce glasses of water daily while symptoms persist. Try to develop a schedule that will allow you to urinate at least every 2 hours. Avoid caffeine such as tea, soda, and coffee while symptoms persist. If symptoms appear to be worsening while taking the antibiotic and you did not develop new symptoms such as fever, chills, or worsening urinary symptoms, please go to the emergency department immediately for further evaluation. If symptoms continue to recur despite use of antibiotics, please follow-up with your primary care physician or with urology for further evaluation. Follow-up as needed.     ED Prescriptions     Medication Sig Dispense Auth. Provider   sulfamethoxazole-trimethoprim (BACTRIM DS) 800-160 MG tablet Take 1 tablet by mouth 2 (two) times daily for 7 days. 14 tablet Adina Puzzo-Warren, Sadie Haber, NP      PDMP not reviewed this encounter.   Abran Cantor, NP 12/02/22 [phone]

## 2022-12-02 NOTE — ED Triage Notes (Signed)
Pt c/o UTI Symptoms urinary frequency, urinary burning and headache. Pt states same symptoms occurred about 4 weeks ago, she was on a 7 day antibiotic it had cleared up and is now back. And seems worse than the last.

## 2022-12-04 LAB — URINE CULTURE: Culture: >=100000 — AB

## 2022-12-27 ENCOUNTER — Encounter: Payer: Self-pay | Admitting: Emergency Medicine

## 2022-12-27 ENCOUNTER — Ambulatory Visit
Admission: EM | Admit: 2022-12-27 | Discharge: 2022-12-27 | Disposition: A | Discharge status: Home / Self Care | Payer: 59 | Attending: Family Medicine | Admitting: Family Medicine

## 2022-12-27 DIAGNOSIS — N390 Urinary tract infection, site not specified: Secondary | ICD-10-CM | POA: Insufficient documentation

## 2022-12-27 LAB — POCT URINALYSIS DIP (MANUAL ENTRY)
Glucose, UA: 100 mg/dL — AB
Nitrite, UA: POSITIVE — AB
Protein Ur, POC: 30 mg/dL — AB
Spec Grav, UA: 1.015 (ref 1.010–1.025)
Urobilinogen, UA: 4 U/dL — AB
pH, UA: 7 (ref 5.0–8.0)

## 2022-12-27 MED ORDER — CEPHALEXIN 500 MG PO CAPS: 500.0000 mg | ORAL_CAPSULE | Freq: Two times a day (BID) | ORAL | 0 refills | Status: AC

## 2022-12-27 NOTE — ED Provider Notes (Signed)
[facility]    CSN: [id number] Arrival date & time: 12/27/22  0825      History   Chief Complaint No chief complaint on file.   HPI Linda Lopez is a 37 y.o. female.   Patient presenting today with 1 day history of lower abdominal pressure, dysuria, urinary frequency.  Denies fever, chills, flank pain, hematuria, vomiting, diarrhea.  So far trying Azo with mild temporary benefit.  Has had 2 other urinary tract infections in the past few months with no changes to routine, products, sexual partners.    Past Medical History:  Diagnosis Date   Guttate psoriasis     Patient Active Problem List   Diagnosis Date Noted   RUQ pain 05/06/2022    Past Surgical History:  Procedure Laterality Date   APPENDECTOMY     LAPAROSCOPIC APPENDECTOMY      OB History     Gravida  1   Para  1   Term  1   Preterm      AB      Living  1      SAB      IAB      Ectopic      Multiple      Live Births  1            Home Medications    Prior to Admission medications   Medication Sig Start Date End Date Taking? Authorizing Provider  cephALEXin (KEFLEX) 500 MG capsule Take 1 capsule (500 mg total) by mouth 2 (two) times daily. 12/27/22  Yes Particia Nearing, PA-C  omeprazole (PRILOSEC) 20 MG capsule Take 1 capsule (20 mg total) by mouth daily. 02/17/14 09/22/18  Toy Cookey, MD    Family History Family History  Problem Relation Age of Onset   Gallbladder disease Mother    Hypertension Father    Cholelithiasis Father    Diabetes Maternal Grandmother    Kidney cancer Maternal Grandfather    Diabetes Paternal Grandfather    Hypertension Paternal Grandfather    Cholelithiasis Paternal Grandfather    Diverticulosis Paternal Grandfather     Social History Social History   Tobacco Use   Smoking status: Never   Smokeless tobacco: Never  Vaping Use   Vaping status: Never Used  Substance Use Topics   Alcohol use: Yes    Comment:  occasionally   Drug use: No     Allergies   Diflucan [fluconazole]   Review of Systems Review of Systems Per HPI  Physical Exam Triage Vital Signs ED Triage Vitals  Encounter Vitals Group     BP 12/27/22 0840 (!) 144/84     Systolic BP Percentile --      Diastolic BP Percentile --      Pulse Rate 12/27/22 0840 84     Resp 12/27/22 0840 18     Temp 12/27/22 0840 98.5 F (36.9 C)     Temp Source 12/27/22 0840 Oral     SpO2 12/27/22 0840 99 %     Weight --      Height --      Head Circumference --      Peak Flow --      Pain Score 12/27/22 0842 2     Pain Loc --      Pain Education --      Exclude from Growth Chart --    No data found.  Updated Vital Signs BP (!) 144/84 (BP Location: Right Arm)  Pulse 84   Temp 98.5 F (36.9 C) (Oral)   Resp 18   LMP 12/23/2022 (Exact Date)   SpO2 99%   Visual Acuity Right Eye Distance:   Left Eye Distance:   Bilateral Distance:    Right Eye Near:   Left Eye Near:    Bilateral Near:     Physical Exam Vitals and nursing note reviewed.  Constitutional:      Appearance: Normal appearance. She is not ill-appearing.  HENT:     Head: Atraumatic.  Eyes:     Extraocular Movements: Extraocular movements intact.     Conjunctiva/sclera: Conjunctivae normal.  Cardiovascular:     Rate and Rhythm: Normal rate and regular rhythm.     Heart sounds: Normal heart sounds.  Pulmonary:     Effort: Pulmonary effort is normal.     Breath sounds: Normal breath sounds.  Abdominal:     General: Bowel sounds are normal. There is no distension.     Palpations: Abdomen is soft.     Tenderness: There is no abdominal tenderness. There is no right CVA tenderness, left CVA tenderness or guarding.  Musculoskeletal:        General: Normal range of motion.     Cervical back: Normal range of motion and neck supple.  Skin:    General: Skin is warm and dry.  Neurological:     Mental Status: She is alert and oriented to person, place, and time.   Psychiatric:        Mood and Affect: Mood normal.        Thought Content: Thought content normal.        Judgment: Judgment normal.      UC Treatments / Results  Labs (all labs ordered are listed, but only abnormal results are displayed) Labs Reviewed  POCT URINALYSIS DIP (MANUAL ENTRY) - Abnormal; Notable for the following components:      Result Value   Color, UA orange (*)    Glucose, UA =100 (*)    Bilirubin, UA small (*)    Ketones, POC UA trace (5) (*)    Blood, UA small (*)    Protein Ur, POC =30 (*)    Urobilinogen, UA 4.0 (*)    Nitrite, UA Positive (*)    Leukocytes, UA Large (3+) (*)    All other components within normal limits  URINE CULTURE    EKG   Radiology No results found.  Procedures Procedures (including critical care time)  Medications Ordered in UC Medications - No data to display  Initial Impression / Assessment and Plan / UC Course  I have reviewed the triage vital signs and the nursing notes.  Pertinent labs & imaging results that were available during my care of the patient were reviewed by me and considered in my medical decision making (see chart for details).     Vitals and exam very reassuring today, urinalysis with unclear results as patient recently took Azo.  Given history of urinary tract infections and consistent symptoms, will treat with Keflex while awaiting urine culture for confirmation of UTI.  Push fluids, Azo as needed.  Return for worsening symptoms.  Final Clinical Impressions(s) / UC Diagnoses   Final diagnoses:  Acute lower UTI   Discharge Instructions   None    ED Prescriptions     Medication Sig Dispense Auth. Provider   cephALEXin (KEFLEX) 500 MG capsule Take 1 capsule (500 mg total) by mouth 2 (two) times daily. 10 capsule Roosvelt Maser  Lanora Manis, PA-C      PDMP not reviewed this encounter.   Roosvelt Maser [place], [place] 12/27/22 [phone]

## 2022-12-27 NOTE — ED Triage Notes (Signed)
Lower abd pressure that started last night.  Burning on urination with odor to urine this morning and urinary frequency.  Took an AZO pill.

## 2022-12-29 LAB — URINE CULTURE

## 2022-12-30 NOTE — Plan of Care (Signed)
[facility] Tonsillectomy/Adenoidectomy, Postoperative PEDS care plan entered in error.

## 2023-02-12 DIAGNOSIS — N3020 Other chronic cystitis without hematuria: Secondary | ICD-10-CM | POA: Diagnosis not present

## 2023-02-12 DIAGNOSIS — R3121 Asymptomatic microscopic hematuria: Secondary | ICD-10-CM | POA: Diagnosis not present

## 2023-02-12 DIAGNOSIS — R8271 Bacteriuria: Secondary | ICD-10-CM | POA: Diagnosis not present

## 2023-03-09 DIAGNOSIS — J189 Pneumonia, unspecified organism: Secondary | ICD-10-CM | POA: Diagnosis not present

## 2023-03-09 DIAGNOSIS — Z6825 Body mass index (BMI) 25.0-25.9, adult: Secondary | ICD-10-CM | POA: Diagnosis not present

## 2023-03-09 DIAGNOSIS — Z789 Other specified health status: Secondary | ICD-10-CM | POA: Diagnosis not present
